# Patient Record
Sex: Male | Born: 1965 | Race: White | Hispanic: No | Marital: Married | Smoking: Never smoker
Health system: Southern US, Community
[De-identification: ages and names within clinical notes are randomized; demographics above are authoritative.]

## PROBLEM LIST (undated history)

## (undated) DIAGNOSIS — R519 Headache, unspecified: Secondary | ICD-10-CM

## (undated) DIAGNOSIS — C801 Malignant (primary) neoplasm, unspecified: Secondary | ICD-10-CM

## (undated) DIAGNOSIS — Z972 Presence of dental prosthetic device (complete) (partial): Secondary | ICD-10-CM

## (undated) NOTE — *Deleted (*Deleted)
[facility]   [phone] [phone]   Clinic Follow up Note   Patient Care Team: Deatra James, MD as PCP - General (Family Medicine) Radonna Ricker, RN as Oncology Nurse Navigator Malachy Mood, MD as Consulting Physician (Oncology) Deatra James, MD as Consulting Physician (Family Medicine) Jeani Hawking, MD as Consulting Physician (Gastroenterology)  Date of Service:  01/23/2020  CHIEF COMPLAINT: F/u of Esophageal cancer   SUMMARY OF ONCOLOGIC HISTORY: Oncology History Overview Note  Cancer Staging No matching staging information was found for the patient.    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 Biopsied. 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 - 3 cm hiatal hernia. - Normal stomach. - Normal examined duodenum    01/09/2020 Initial Biopsy   FINAL MICROSCOPIC DIAGNOSIS:   A. ESOPHAGEAL MASS, BIOPSY:  -  Adenocarcinoma arising in a background of high-grade dysplasia with  focal intestinal metaplasia  -  See comment   COMMENT:   Based on the biopsy, the adenocarcinoma appears poorly differentiated.  Tissue is available for ancillary studies upon request.  Dr. Elnoria Howard was  notified of these results on January 13, 2020.  Dr. Kenard Gower reviewed  the case and agrees with the above diagnosis.    01/09/2020 Imaging   CT chest W contrast 01/09/20  IMPRESSION: 1. 5 x 4 cm mid to lower esophageal mass with necrotic appearing paraesophageal and gastrohepatic ligament lymph nodes. 2. 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 No findings suspicious for hepatic metastatic disease. 3. Small basilar pulmonary nodules, indeterminate.  Attention on future scans is suggested. 4. Multiple bilateral rib lesions worrisome for metastatic disease. 5. Recommend PET-CT for further evaluation and staging. 6. Age advanced atherosclerotic calcifications involving the aorta and iliac arteries. 7. Aortic atherosclerosis.   Aortic Atherosclerosis (ICD10-I70.0).     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     01/22/2020 PET scan   IMPRESSION: 1. Hypermetabolic large distal esophageal mass with scattered skeletal metastatic lesions as well as hypermetabolic nodal involvement in the lower chest, and also in the upper abdomen just below the hiatus. 2. Benign liver lesions compatible with hemangiomas and cysts. 3.  Aortic Atherosclerosis (ICD10-I70.0).  Coronary atherosclerosis. 4. The very small pulmonary nodules previously shown primarily at the lung bases are not appreciably hypermetabolic but are below sensitive PET-CT size thresholds, and merit surveillance.   02/02/2020 -  Radiation Therapy   PENDING Concurrent chemo Radiation with Dr Mitzi Hansen starting 02/02/20   02/02/2020 -  Chemotherapy   PENDING Concurrent chemo Radiation with weekly CT starting 02/02/20      CURRENT THERAPY:  PENDING Concurrent chemo Radiation with weekly CT starting 02/02/20  INTERVAL HISTORY: *** Mark Buck is here for a follow up of Esophageal cancer. He presents to the clinic alone.    REVIEW OF SYSTEMS:  *** Constitutional: Denies fevers, chills or abnormal weight loss Eyes: Denies blurriness of vision Ears, nose, mouth, throat, and face: Denies mucositis or sore throat Respiratory: Denies cough, dyspnea or wheezes Cardiovascular: Denies palpitation, chest  discomfort or lower extremity swelling Gastrointestinal:  Denies nausea, heartburn or  change in bowel habits Skin: Denies abnormal skin rashes Lymphatics: Denies new lymphadenopathy or easy bruising Neurological:Denies numbness, tingling or new weaknesses Behavioral/Psych: Mood is stable, no new changes  All other systems were reviewed with the patient and are negative.  MEDICAL HISTORY:  Past Medical History:  Diagnosis Date  . Cancer (HCC)    esophageal cancer  . Headache   . Wears dentures     SURGICAL HISTORY: Past Surgical History:  Procedure Laterality Date  . BIOPSY  01/09/2020   Procedure: BIOPSY;  Surgeon: Jeani Hawking, MD;  Location: [facility];  Service: Endoscopy;;  . ESOPHAGOGASTRODUODENOSCOPY (EGD) WITH PROPOFOL N/A 01/09/2020   Procedure: ESOPHAGOGASTRODUODENOSCOPY (EGD) WITH PROPOFOL;  Surgeon: Jeani Hawking, MD;  Location: [facility];  Service: Endoscopy;  Laterality: N/A;    I have reviewed the social history and family history with the patient and they are unchanged from previous note.  ALLERGIES:  is allergic to codeine.  MEDICATIONS:  Current Outpatient Medications  Medication Sig Dispense Refill  . ondansetron (ZOFRAN) 8 MG tablet Take 1 tablet (8 mg total) by mouth 2 (two) times daily as needed for refractory nausea / vomiting. Start on day 3 after chemo. 30 tablet 1  . prochlorperazine (COMPAZINE) 10 MG tablet Take 1 tablet (10 mg total) by mouth every 6 (six) hours as needed (Nausea or vomiting). 30 tablet 1   No current facility-administered medications for this visit.    PHYSICAL EXAMINATION: ECOG PERFORMANCE STATUS: {CHL ONC ECOG PS:[id number]}  There were no vitals filed for this visit. There were no vitals filed for this visit. *** GENERAL:alert, no distress and comfortable SKIN: skin color, texture, turgor are normal, no rashes or significant lesions EYES: normal, Conjunctiva are pink and non-injected, sclera clear {OROPHARYNX:no exudate, no erythema and lips, buccal mucosa, and tongue normal}  NECK: supple, thyroid  normal size, non-tender, without nodularity LYMPH:  no palpable lymphadenopathy in the cervical, axillary {or inguinal} LUNGS: clear to auscultation and percussion with normal breathing effort HEART: regular rate & rhythm and no murmurs and no lower extremity edema ABDOMEN:abdomen soft, non-tender and normal bowel sounds Musculoskeletal:no cyanosis of digits and no clubbing  NEURO: alert & oriented x 3 with fluent speech, no focal motor/sensory deficits  LABORATORY DATA:  I have reviewed the data as listed CBC Latest Ref Rng & Units 01/15/2020  WBC 4.0 - 10.5 K/uL 7.1  Hemoglobin 13.0 - 17.0 g/dL 08.6  Hematocrit 39 - 52 % 47.2  Platelets 150 - 400 K/uL 259     CMP Latest Ref Rng & Units 01/15/2020  Glucose 70 - 99 mg/dL 96  BUN 6 - 20 mg/dL 8  Creatinine 5.78 - 4.69 mg/dL 6.29  Sodium 528 - 413 mmol/L 136  Potassium 3.5 - 5.1 mmol/L 3.5  Chloride 98 - 111 mmol/L 102  CO2 22 - 32 mmol/L 26  Calcium 8.9 - 10.3 mg/dL 9.2  Total Protein 6.5 - 8.1 g/dL 7.0  Total Bilirubin 0.3 - 1.2 mg/dL 2.4(M)  Alkaline Phos 38 - 126 U/L 202(H)  AST 15 - 41 U/L 20  ALT 0 - 44 U/L 21      RADIOGRAPHIC STUDIES: I have personally reviewed the radiological images as listed and agreed with the findings in the report. NM PET Image Initial (PI) Skull Base To Thigh  Result Date: 01/22/2020 CLINICAL DATA:  Initial treatment strategy for esophageal cancer. EXAM: NUCLEAR MEDICINE PET SKULL BASE TO THIGH TECHNIQUE:  9.9 mCi F-18 FDG was injected intravenously. Full-ring PET imaging was performed from the skull base to thigh after the radiotracer. CT data was obtained and used for attenuation correction and anatomic localization. Fasting blood glucose: 85 mg/dl COMPARISON:  96/29/5284 CT scan FINDINGS: Mediastinal blood pool activity: SUV max 2.4 Liver activity: SUV max NA NECK: No significant abnormal hypermetabolic activity in this region. Incidental CT findings: none CHEST: Distal esophageal mass maximum SUV  15.1. Posterior para-aortic lymph node 1.3 cm in short axis on image 104/3, maximum SUV 10.3, compatible with malignancy. A smaller paraesophageal lymph node just cephalad to the level of the mass measures 0.7 cm in short axis on image 98 of series 3 with maximum SUV of 3.5 probably reflecting early malignant involvement. The small pulmonary nodules shown on the prior exam are not appreciably hypermetabolic but are below sensitive PET-CT size thresholds and merit surveillance. Incidental CT findings: Coronary, aortic arch, and branch vessel atherosclerotic vascular disease. Azygos fissure noted. ABDOMEN/PELVIS: Hypermetabolic right gastric lymph nodes just below the hiatus. The more right eccentric lymph node measures 1.5 cm in short axis on image 148 of series 3 with maximum SUV of 8.3, compatible with malignant involvement. Incidental CT findings: None of the liver lesions are hypermetabolic, these are compatible with hemangiomas and cysts. Aortoiliac atherosclerotic vascular disease. SKELETON: Hypermetabolic osseous metastatic lesions involving multiple ribs, the right inferior scapula, the bony pelvis, and the right proximal femur. There is also a suspected right vertebral body lesion at the T11 vertebral level index lytic expansile lesion of the right seventh rib has a maximum SUV of 13.0. Index lytic lesion of the left anterior iliac bone has a maximum SUV of 11.0. Incidental CT findings: none IMPRESSION: 1. Hypermetabolic large distal esophageal mass with scattered skeletal metastatic lesions as well as hypermetabolic nodal involvement in the lower chest, and also in the upper abdomen just below the hiatus. 2. Benign liver lesions compatible with hemangiomas and cysts. 3.  Aortic Atherosclerosis (ICD10-I70.0).  Coronary atherosclerosis. 4. The very small pulmonary nodules previously shown primarily at the lung bases are not appreciably hypermetabolic but are below sensitive PET-CT size thresholds, and merit  surveillance. Electronically Signed   By: Gaylyn Rong M.D.   On: 01/22/2020 15:24     ASSESSMENT & PLAN:  Mark Buck is a 2 y.o. male with    1. Mid-low esophageal adenocarcinoma, poorly differentiated, TxNxMx -I reviewed his endoscopy findings, biopsy results, with patient and his wife in details. The tumor in the mid and low esophagus is causing significant obstruction, Dr. Elnoria Howard was not able to pass the scope. This is likely locally advanced disease. -We reviewed his staging CT scanning image in person, and I discussed he has had recent fall and injury to his right side, so the bone lesions in his right ribs are likely related to the trauma. Multiple liver lesions to be hemangioma, no other definitive evidence of distant metastasis on CT. -Plan to proceed with PET scan for further staging, if it is negative for distant metastasis, I do not feel he needs EUS for staging, this is likely at least T3 lesion  -due to his headaches, rad/onc has ordered brain MRI  -If PET shows possible distant metastasis, will consider further biopsy -I discussed standard treatment for locally advanced esophageal cancer, which includes neoadjuvant concurrent chemoradiation with weekly carboplatin and paclitaxel for 5 to 6 weeks, esophagectomy, and adjuvant nivolumab for 1 year with residual disease on surgical sample.  -He is young and  fit, will be a good candidate for intensive treatment. --Chemotherapy consent: Side effects including but does not not limited to, fatigue, nausea, vomiting, diarrhea, hair loss, neuropathy, fluid retention, renal and kidney dysfunction, neutropenic fever, needed for blood transfusion, bleeding, were discussed with patient in great detail.He agrees to proceed. -plan to start treatment in a few weeks   2. Dysphagia, odynophagia, significant weight loss, protein and calorie malnutrition -Secondary to his newly diagnosed esophageal cancer -We reviewed nutrition supplement,  will give him samples of nutrition supplements today -urgent dietician referral -will refer him to cardiothoracic surgeon Dr. Cliffton Asters for evaluation and possible J-tube placement    PLAN:  -lab today and IVF  -urgent dietician referral  -urgent referral to cardiothoracic surgeon Dr. Cliffton Asters -PET and brain MRI are scheduled for next week  -chemo class next week -f/u after above workup     No problem-specific Assessment & Plan notes found for this encounter.   No orders of the defined types were placed in this encounter.  All questions were answered. The patient knows to call the clinic with any problems, questions or concerns. No barriers to learning was detected. The total time spent in the appointment was {CHL ONC TIME VISIT - ZOXWR:[id number]}.     Delphina Cahill 01/23/2020   Rogelia Rohrer, am acting as scribe for Malachy Mood, MD.   {Add scribe attestation statement}

---

## 1998-02-15 ENCOUNTER — Ambulatory Visit (HOSPITAL_COMMUNITY): Admission: RE | Admit: 1998-02-15 | Discharge: 1998-02-15 | Payer: Self-pay | Admitting: Family Medicine

## 1998-02-15 ENCOUNTER — Encounter: Payer: Self-pay | Admitting: Family Medicine

## 2000-11-21 ENCOUNTER — Ambulatory Visit (HOSPITAL_COMMUNITY): Admission: RE | Admit: 2000-11-21 | Discharge: 2000-11-21 | Payer: Self-pay | Admitting: Family Medicine

## 2002-10-21 ENCOUNTER — Emergency Department (HOSPITAL_COMMUNITY): Admission: AC | Admit: 2002-10-21 | Discharge: 2002-10-21 | Payer: Self-pay

## 2002-10-21 ENCOUNTER — Encounter: Payer: Self-pay | Admitting: Surgery

## 2015-12-21 ENCOUNTER — Ambulatory Visit (INDEPENDENT_AMBULATORY_CARE_PROVIDER_SITE_OTHER): Payer: Self-pay

## 2015-12-21 ENCOUNTER — Encounter (HOSPITAL_COMMUNITY): Payer: Self-pay | Admitting: Family Medicine

## 2015-12-21 ENCOUNTER — Ambulatory Visit (HOSPITAL_COMMUNITY)
Admission: EM | Admit: 2015-12-21 | Discharge: 2015-12-21 | Disposition: A | Discharge status: Home / Self Care | Payer: Self-pay | Attending: Family Medicine | Admitting: Family Medicine

## 2015-12-21 DIAGNOSIS — M25511 Pain in right shoulder: Secondary | ICD-10-CM

## 2015-12-21 DIAGNOSIS — R0789 Other chest pain: Secondary | ICD-10-CM

## 2015-12-21 DIAGNOSIS — W19XXXA Unspecified fall, initial encounter: Secondary | ICD-10-CM

## 2015-12-21 NOTE — ED Triage Notes (Signed)
Pt here for fall off the back of his truck that happened Saturday. sts about 4 feet. sts that he fell on right side hurting right shoulder and ribs. sts hard to lay flat due to pain. Denies any trouble breathing.

## 2015-12-21 NOTE — Discharge Instructions (Signed)
You appear to have 2 cracked ribs. These usually takes several weeks to heal. They're in the right place and they're not interfering with your lung function.

## 2015-12-21 NOTE — ED Provider Notes (Signed)
[place]    CSN: [id number] Arrival date & time: 12/21/15  1924  First Provider Contact:  First MD Initiated Contact with Patient 12/21/15 2006        History   Chief Complaint Chief Complaint  Patient presents with  . Fall  . Rib Injury  . Shoulder Pain    HPI Mark Buck is a 50 y.o. male.   This is a 50 year old gentleman brought in by his wife after having fallen 6 days ago. He complains about right shoulder and right rib pain after having fallen off of a truck and landing with his outstretched arm. He first he had severe pain and difficulty moving his arm, but subsequently, he is been able to use his arm.  He's been going to work since the injury.  Patient's had no shortness of breath. He does have extreme pain in his right lateral ribs when he coughs or sneezes. He also says that it is difficult lying flat on his back because of pain.      History reviewed. No pertinent past medical history.  There are no active problems to display for this patient.   History reviewed. No pertinent surgical history.     Home Medications    Prior to Admission medications   Not on File    Family History History reviewed. No pertinent family history.  Social History Social History  Substance Use Topics  . Smoking status: Never Smoker  . Smokeless tobacco: Never Used  . Alcohol use Not on file     Allergies   Review of patient's allergies indicates no known allergies.   Review of Systems Review of Systems  Constitutional: Negative.   HENT: Negative.   Respiratory: Positive for chest tightness. Negative for shortness of breath, wheezing and stridor.   Cardiovascular: Positive for chest pain. Negative for palpitations and leg swelling.  Gastrointestinal: Negative.   Genitourinary: Negative.   Neurological: Negative.      Physical Exam Triage Vital Signs ED Triage Vitals  Enc Vitals Group     BP 12/21/15 2001 137/96     Pulse Rate  12/21/15 2001 80     Resp 12/21/15 2001 12     Temp 12/21/15 2001 98.7 F (37.1 C)     Temp Source 12/21/15 2001 Oral     SpO2 12/21/15 2001 99 %     Weight --      Height --      Head Circumference --      Peak Flow --      Pain Score 12/21/15 2009 4     Pain Loc --      Pain Edu? --      Excl. in [place]? --    No data found.   Updated Vital Signs BP 137/96 (BP Location: Left Arm)   Pulse 80   Temp 98.7 F (37.1 C) (Oral)   Resp 12   SpO2 99%   Visual Acuity     Physical Exam  Constitutional: He is oriented to person, place, and time. He appears well-developed and well-nourished.  HENT:  Head: Normocephalic and atraumatic.  Nose: Nose normal.  Eyes: Conjunctivae and EOM are normal. Pupils are equal, round, and reactive to light.  Neck: Normal range of motion. Neck supple.  Cardiovascular: Normal rate, regular rhythm and normal heart sounds.   Pulmonary/Chest: Effort normal and breath sounds normal.  Abdominal: Soft.  Musculoskeletal: Normal range of motion.  Neurological: He is alert and oriented to  person, place, and time. No cranial nerve deficit. Coordination normal.  Patient is able to move his right upper extremity all directions without problem  Skin: Skin is warm and dry.  Nursing note and vitals reviewed.    UC Treatments / Results  Labs (all labs ordered are listed, but only abnormal results are displayed) Labs Reviewed - No data to display  EKG  EKG Interpretation None       Radiology Dg Ribs Unilateral W/chest Right  Result Date: 12/21/2015 CLINICAL DATA:  Patient fell off the back of a truck and injured his rt side including his rt shoulder and ribs, injury happened 1 week ago. EXAM: RIGHT RIBS AND CHEST - 3+ VIEW COMPARISON:  None. FINDINGS: No pneumothorax. Scarring or subsegmental atelectasis at the left lung base, contralateral to the side of injury. Azygos fissure noted. Cardiac and mediastinal margins appear normal. Right anterior sixth and  seventh rib irregularities on the final image, potentially from nondisplaced fractures. IMPRESSION: 1. Possible nondisplaced right anterior fifth and sixth rib fractures. 2. No right pleural effusion or pneumothorax. 3. Blunting of the left lateral costophrenic angle, probably from scarring. Electronically Signed   By: Van Clines M.D.   On: 12/21/2015 21:24   Dg Shoulder Right  Result Date: 12/21/2015 CLINICAL DATA:  Fall from a truck, injuring the right shoulder. EXAM: RIGHT SHOULDER - 2+ VIEW FINDINGS: No glenohumeral or AC joint malalignment. I do not see a clavicular fracture or other fracture involving the shoulder. IMPRESSION: Negative. Electronically Signed   By: Van Clines M.D.   On: 12/21/2015 21:25    Procedures Procedures (including critical care time)  Medications Ordered in UC Medications - No data to display   Initial Impression / Assessment and Plan / UC Course  I have reviewed the triage vital signs and the nursing notes.  Pertinent labs & imaging results that were available during my care of the patient were reviewed by me and considered in my medical decision making (see chart for details).  Clinical Course      Final Clinical Impressions(s) / UC Diagnoses   Final diagnoses:  Shoulder pain, acute, right  Fall, initial encounter  Chest wall pain    New Prescriptions New Prescriptions   No medications on file  Patient refuses medication. He just wanted to make sure he did not have internal damage.   Robyn Haber, MD 12/21/15 2130

## 2016-08-08 ENCOUNTER — Telehealth: Payer: Self-pay | Admitting: Physician Assistant

## 2016-08-08 ENCOUNTER — Ambulatory Visit (INDEPENDENT_AMBULATORY_CARE_PROVIDER_SITE_OTHER): Payer: Self-pay | Admitting: Physician Assistant

## 2016-08-08 VITALS — BP 134/88 | HR 72 | Temp 98.1°F | Resp 17 | Ht 70.0 in | Wt 214.0 lb

## 2016-08-08 DIAGNOSIS — Z0289 Encounter for other administrative examinations: Secondary | ICD-10-CM

## 2016-08-08 NOTE — Patient Instructions (Signed)
     IF you received an x-ray today, you will receive an invoice from [facility]. Please contact [facility] at [phone] with questions or concerns regarding your invoice.   IF you received labwork today, you will receive an invoice from [facility]. Please contact [facility] at [phone] with questions or concerns regarding your invoice.   Our billing staff will not be able to assist you with questions regarding bills from these companies.  You will be contacted with the lab results as soon as they are available. The fastest way to get your results is to activate your My Chart account. Instructions are located on the last page of this paperwork. If you have not heard from us regarding the results in 2 weeks, please contact this office.     

## 2016-08-08 NOTE — Telephone Encounter (Signed)
Pt was returning Stephanie's call regarding some paperwork.   Please Advise [phone]

## 2016-08-09 NOTE — Telephone Encounter (Signed)
PLEASE obtain more information.  I did not contact him.  DOT was performed.  Given to staff upfront for check out.

## 2016-08-10 NOTE — Progress Notes (Signed)
PRIMARY CARE AT [facility] [address] [place] [address] [phone]  Date:  08/08/2016   Name:  Mark Buck   DOB:  07/04/1965   MRN:  [id number]  PCP:  No PCP Per Patient    History of Present Illness:  Mark Buck is a 51 y.o. male patient who presents to PCP with  Chief Complaint  Patient presents with  . Employment Physical    DOT     No concerns at this time.   There are no active problems to display for this patient.   No past medical history on file.  No past surgical history on file.  Social History  Substance Use Topics  . Smoking status: Never Smoker  . Smokeless tobacco: Never Used  . Alcohol use No    No family history on file.  Allergies  Allergen Reactions  . Codeine Nausea And Vomiting    Medication list has been reviewed and updated.  No current outpatient prescriptions on file prior to visit.   No current facility-administered medications on file prior to visit.     ROS ROS otherwise unremarkable unless listed above.  Physical Examination: BP 134/88   Pulse 72   Temp 98.1 F (36.7 C) (Oral)   Resp 17   Ht 5\' 10"  (1.778 m)   Wt 214 lb (97.1 kg)   SpO2 97%   BMI 30.71 kg/m  Ideal Body Weight: Weight in (lb) to have BMI = 25: 173.9  Physical Exam  Constitutional: He is oriented to person, place, and time. He appears well-developed and well-nourished. No distress.  HENT:  Head: Normocephalic and atraumatic.  Right Ear: Tympanic membrane, external ear and ear canal normal.  Left Ear: Tympanic membrane, external ear and ear canal normal.  Eyes: Conjunctivae and EOM are normal. Pupils are equal, round, and reactive to light.  Cardiovascular: Normal rate and regular rhythm.  Exam reveals no friction rub.   No murmur heard. Pulmonary/Chest: Effort normal. No respiratory distress. He has no wheezes.  Abdominal: Soft. Bowel sounds are normal. He exhibits no distension and no mass. There is no tenderness. Hernia confirmed  negative in the right inguinal area and confirmed negative in the left inguinal area.  Musculoskeletal: Normal range of motion. He exhibits no edema or tenderness.  Neurological: He is alert and oriented to person, place, and time. He displays normal reflexes.  Skin: Skin is warm and dry. He is not diaphoretic.  Psychiatric: He has a normal mood and affect. His behavior is normal.     Assessment and Plan: Mark Buck is a 51 y.o. male who is here today for cc of dot.   Encounter for examination required by Department of Transportation (DOT)  Ivar Drape, PA-C [facility] 4/26/20186:23 AM

## 2017-07-16 ENCOUNTER — Encounter: Payer: Self-pay | Admitting: Physician Assistant

## 2017-12-12 ENCOUNTER — Emergency Department (HOSPITAL_COMMUNITY)
Admission: EM | Admit: 2017-12-12 | Discharge: 2017-12-12 | Disposition: A | Discharge status: Home / Self Care | Payer: 59 | Attending: Emergency Medicine | Admitting: Emergency Medicine

## 2017-12-12 ENCOUNTER — Ambulatory Visit (HOSPITAL_COMMUNITY)
Admission: EM | Admit: 2017-12-12 | Discharge: 2017-12-12 | Disposition: A | Discharge status: Other Acute Inpatient Hospital | Payer: Self-pay

## 2017-12-12 ENCOUNTER — Encounter (HOSPITAL_COMMUNITY): Payer: Self-pay | Admitting: *Deleted

## 2017-12-12 ENCOUNTER — Emergency Department (HOSPITAL_COMMUNITY): Payer: 59

## 2017-12-12 DIAGNOSIS — S0990XA Unspecified injury of head, initial encounter: Secondary | ICD-10-CM | POA: Diagnosis present

## 2017-12-12 DIAGNOSIS — S01512A Laceration without foreign body of oral cavity, initial encounter: Secondary | ICD-10-CM | POA: Diagnosis not present

## 2017-12-12 DIAGNOSIS — Y929 Unspecified place or not applicable: Secondary | ICD-10-CM | POA: Insufficient documentation

## 2017-12-12 DIAGNOSIS — S0181XA Laceration without foreign body of other part of head, initial encounter: Secondary | ICD-10-CM | POA: Diagnosis not present

## 2017-12-12 DIAGNOSIS — S0083XA Contusion of other part of head, initial encounter: Secondary | ICD-10-CM

## 2017-12-12 DIAGNOSIS — Z23 Encounter for immunization: Secondary | ICD-10-CM | POA: Insufficient documentation

## 2017-12-12 DIAGNOSIS — Y939 Activity, unspecified: Secondary | ICD-10-CM | POA: Diagnosis not present

## 2017-12-12 MED ORDER — TETANUS-DIPHTH-ACELL PERTUSSIS 5-2.5-18.5 LF-MCG/0.5 IM SUSP
0.5000 mL | Freq: Once | INTRAMUSCULAR | Status: AC
  Administered 2017-12-12: 0.5 mL via INTRAMUSCULAR
  Filled 2017-12-12: qty 0.5

## 2017-12-12 MED ORDER — ONDANSETRON 4 MG PO TBDP
4.0000 mg | ORAL_TABLET | Freq: Once | ORAL | Status: AC
  Administered 2017-12-12: 4 mg via ORAL
  Filled 2017-12-12: qty 1

## 2017-12-12 MED ORDER — CEPHALEXIN 250 MG PO CAPS
500.0000 mg | ORAL_CAPSULE | Freq: Once | ORAL | Status: AC
  Administered 2017-12-12: 500 mg via ORAL
  Filled 2017-12-12: qty 2

## 2017-12-12 MED ORDER — HYDROMORPHONE HCL 1 MG/ML IJ SOLN
1.0000 mg | Freq: Once | INTRAMUSCULAR | Status: AC
  Administered 2017-12-12: 1 mg via INTRAVENOUS
  Filled 2017-12-12: qty 1

## 2017-12-12 MED ORDER — CEPHALEXIN 500 MG PO CAPS: 500.0000 mg | ORAL_CAPSULE | Freq: Three times a day (TID) | ORAL | 0 refills | Status: DC

## 2017-12-12 MED ORDER — CHLORHEXIDINE GLUCONATE 0.12% ORAL RINSE (MEDLINE KIT): 15.0000 mL | Freq: Two times a day (BID) | OROMUCOSAL | 0 refills | Status: DC

## 2017-12-12 MED ORDER — OXYCODONE-ACETAMINOPHEN 5-325 MG PO TABS: 1.0000 | ORAL_TABLET | Freq: Four times a day (QID) | ORAL | 0 refills | Status: DC | PRN

## 2017-12-12 MED ORDER — HYDROMORPHONE HCL 1 MG/ML IJ SOLN
1.0000 mg | Freq: Once | INTRAMUSCULAR | Status: AC
  Administered 2017-12-12: 1 mg via INTRAMUSCULAR
  Filled 2017-12-12: qty 1

## 2017-12-12 MED ORDER — LIDOCAINE HCL 2 % IJ SOLN
20.0000 mL | Freq: Once | INTRAMUSCULAR | Status: AC
  Administered 2017-12-12: 400 mg
  Filled 2017-12-12: qty 20

## 2017-12-12 MED ORDER — ONDANSETRON HCL 4 MG PO TABS: 4.0000 mg | ORAL_TABLET | Freq: Four times a day (QID) | ORAL | 0 refills | Status: DC

## 2017-12-12 NOTE — Discharge Instructions (Addendum)
Please read attached information. If you experience any new or worsening signs or symptoms please return to the emergency room for evaluation. Please follow-up with your primary care provider or specialist as discussed. Please use medication prescribed only as directed and discontinue taking if you have any concerning signs or symptoms.   °

## 2017-12-12 NOTE — ED Notes (Signed)
Pt to CT

## 2017-12-12 NOTE — ED Notes (Signed)
ED Provider at bedside. 

## 2017-12-12 NOTE — ED Triage Notes (Signed)
Pt in after a fall at work, was on a scooter and fell and landed on his face, laceration noted to his forehead, laceration to his tongue and inside of his lower lip, denies LOC, fall was witnessed, abrasions noted to bilateral arms as well

## 2017-12-12 NOTE — ED Notes (Signed)
PA-C at bedside for procedure.

## 2017-12-12 NOTE — ED Provider Notes (Signed)
[facility]ER[facility] DEPARTMENT Provider Note   CSN: [id number] Arrival date & time: 12/12/17  1304   History   Chief Complaint Chief Complaint  Patient presents with  . Fall    HPI Mark Buck is a 52 y.o. male.  HPI   52 year old male presents status post fall. He was riding an [employer] when he fell off treating his face on the ground. He notes superficial abrasions laceration to the forehead, laceration to his tongue and lower lip. Patient denies any loss of consciousness, no confusion, no neurological deficits neck pain back pain.patient is uncertain of his last tetanus vaccination.   History reviewed. No pertinent past medical history.  There are no active problems to display for this patient.   History reviewed. No pertinent surgical history.      Home Medications    Prior to Admission medications   Medication Sig Start Date End Date Taking? Authorizing Provider  cephALEXin (KEFLEX) 500 MG capsule Take 1 capsule (500 mg total) by mouth 3 (three) times daily. 12/12/17   Virlan Kempker, Dellis Filbert, PA-C  chlorhexidine gluconate, MEDLINE KIT, (PERIDEX) 0.12 % solution Use as directed 15 mLs in the mouth or throat 2 (two) times daily. 12/12/17   Tashonda Pinkus, Dellis Filbert, PA-C  oxyCODONE-acetaminophen (PERCOCET/ROXICET) 5-325 MG tablet Take 1 tablet by mouth every 6 (six) hours as needed for severe pain. 12/12/17   Okey Regal, PA-C    Family History History reviewed. No pertinent family history.  Social History Social History   Tobacco Use  . Smoking status: Never Smoker  . Smokeless tobacco: Never Used  Substance Use Topics  . Alcohol use: No  . Drug use: No     Allergies   Codeine   Review of Systems Review of Systems  All other systems reviewed and are negative.    Physical Exam Updated Vital Signs BP (!) 159/102 (BP Location: Right Arm)   Pulse 71   Temp 98.7 F (37.1 C) (Oral)   Resp 18   SpO2 96%   Physical Exam    Constitutional: He is oriented to person, place, and time. He appears well-developed and well-nourished.  HENT:  Head: Normocephalic and atraumatic.  Superficial abrasion noted to the forehead nose and chin, 2 cm laceration to the forehead- lower lip with abrasions, gumline mucosa with tear away from the gumline approximately 1.5 cm- tong with a 3 cm laceration linear with star formation at the distal aspect, bottom the tongue intact no full thickness laceration  1 cm laceration to left inner lip  Eyes: Pupils are equal, round, and reactive to light. Conjunctivae are normal. Right eye exhibits no discharge. Left eye exhibits no discharge. No scleral icterus.  Neck: Normal range of motion. No JVD present. No tracheal deviation present.  Pulmonary/Chest: Effort normal. No stridor.  Musculoskeletal:  No C or T-spine tenderness to palpation neck supple. Full range of motion  Neurological: He is alert and oriented to person, place, and time. Coordination normal.  Psychiatric: He has a normal mood and affect. His behavior is normal. Judgment and thought content normal.  Nursing note and vitals reviewed.   ED Treatments / Results  Labs (all labs ordered are listed, but only abnormal results are displayed) Labs Reviewed - No data to display  EKG None  Radiology Ct Head Wo Contrast  Result Date: 12/12/2017 CLINICAL DATA:  Facial laceration after fall off scooter at work. EXAM: CT HEAD WITHOUT CONTRAST CT MAXILLOFACIAL WITHOUT CONTRAST CT CERVICAL SPINE WITHOUT CONTRAST TECHNIQUE:  Multidetector CT imaging of the head, cervical spine, and maxillofacial structures were performed using the standard protocol without intravenous contrast. Multiplanar CT image reconstructions of the cervical spine and maxillofacial structures were also generated. COMPARISON:  None. FINDINGS: CT HEAD FINDINGS Brain: No evidence of acute infarction, hemorrhage, hydrocephalus, extra-axial collection or mass lesion/mass  effect. Vascular: No hyperdense vessel or unexpected calcification. Skull: Bilateral parotid craniotomies are noted. No acute fracture is noted. Other: Mild left frontal scalp laceration and hematoma is noted. CT MAXILLOFACIAL FINDINGS Osseous: No fracture or mandibular dislocation. No destructive process. Orbits: Negative. No traumatic or inflammatory finding. Sinuses: Clear. Soft tissues: Negative. CT CERVICAL SPINE FINDINGS Alignment: Normal. Skull base and vertebrae: No acute fracture. No primary bone lesion or focal pathologic process. Soft tissues and spinal canal: No prevertebral fluid or swelling. No visible canal hematoma. Disc levels:  Normal. Upper chest: Negative. Other: None. IMPRESSION: Mild left frontal scalp laceration and hematoma. No acute intracranial abnormality seen. No abnormality seen in the maxillofacial region. Normal cervical spine. Electronically Signed   By: Marijo Conception, M.D.   On: 12/12/2017 14:50   Ct Cervical Spine Wo Contrast  Result Date: 12/12/2017 CLINICAL DATA:  Facial laceration after fall off scooter at work. EXAM: CT HEAD WITHOUT CONTRAST CT MAXILLOFACIAL WITHOUT CONTRAST CT CERVICAL SPINE WITHOUT CONTRAST TECHNIQUE: Multidetector CT imaging of the head, cervical spine, and maxillofacial structures were performed using the standard protocol without intravenous contrast. Multiplanar CT image reconstructions of the cervical spine and maxillofacial structures were also generated. COMPARISON:  None. FINDINGS: CT HEAD FINDINGS Brain: No evidence of acute infarction, hemorrhage, hydrocephalus, extra-axial collection or mass lesion/mass effect. Vascular: No hyperdense vessel or unexpected calcification. Skull: Bilateral parotid craniotomies are noted. No acute fracture is noted. Other: Mild left frontal scalp laceration and hematoma is noted. CT MAXILLOFACIAL FINDINGS Osseous: No fracture or mandibular dislocation. No destructive process. Orbits: Negative. No traumatic or  inflammatory finding. Sinuses: Clear. Soft tissues: Negative. CT CERVICAL SPINE FINDINGS Alignment: Normal. Skull base and vertebrae: No acute fracture. No primary bone lesion or focal pathologic process. Soft tissues and spinal canal: No prevertebral fluid or swelling. No visible canal hematoma. Disc levels:  Normal. Upper chest: Negative. Other: None. IMPRESSION: Mild left frontal scalp laceration and hematoma. No acute intracranial abnormality seen. No abnormality seen in the maxillofacial region. Normal cervical spine. Electronically Signed   By: Marijo Conception, M.D.   On: 12/12/2017 14:50   Ct Maxillofacial Wo Contrast  Result Date: 12/12/2017 CLINICAL DATA:  Facial laceration after fall off scooter at work. EXAM: CT HEAD WITHOUT CONTRAST CT MAXILLOFACIAL WITHOUT CONTRAST CT CERVICAL SPINE WITHOUT CONTRAST TECHNIQUE: Multidetector CT imaging of the head, cervical spine, and maxillofacial structures were performed using the standard protocol without intravenous contrast. Multiplanar CT image reconstructions of the cervical spine and maxillofacial structures were also generated. COMPARISON:  None. FINDINGS: CT HEAD FINDINGS Brain: No evidence of acute infarction, hemorrhage, hydrocephalus, extra-axial collection or mass lesion/mass effect. Vascular: No hyperdense vessel or unexpected calcification. Skull: Bilateral parotid craniotomies are noted. No acute fracture is noted. Other: Mild left frontal scalp laceration and hematoma is noted. CT MAXILLOFACIAL FINDINGS Osseous: No fracture or mandibular dislocation. No destructive process. Orbits: Negative. No traumatic or inflammatory finding. Sinuses: Clear. Soft tissues: Negative. CT CERVICAL SPINE FINDINGS Alignment: Normal. Skull base and vertebrae: No acute fracture. No primary bone lesion or focal pathologic process. Soft tissues and spinal canal: No prevertebral fluid or swelling. No visible canal hematoma. Disc levels:  Normal. Upper  chest: Negative.  Other: None. IMPRESSION: Mild left frontal scalp laceration and hematoma. No acute intracranial abnormality seen. No abnormality seen in the maxillofacial region. Normal cervical spine. Electronically Signed   By: Marijo Conception, M.D.   On: 12/12/2017 14:50    Procedures .Marland KitchenLaceration Repair Date/Time: 12/12/2017 4:25 PM Performed by: Okey Regal, PA-C Authorized by: Okey Regal, PA-C   Consent:    Consent obtained:  Verbal   Consent given by:  Patient   Risks discussed:  Infection, need for additional repair, pain, retained foreign body, poor cosmetic result, nerve damage and poor wound healing   Alternatives discussed:  Delayed treatment and no treatment Anesthesia (see MAR for exact dosages):    Anesthesia method:  Local infiltration   Local anesthetic:  Lidocaine 2% w/o epi Laceration details:    Location: tongue.   Length (cm):  3 Repair type:    Repair type:  Simple Exploration:    Hemostasis achieved with:  Direct pressure   Wound exploration: wound explored through full range of motion and entire depth of wound probed and visualized     Wound extent: no fascia violation noted, no foreign bodies/material noted, no muscle damage noted, no nerve damage noted and no vascular damage noted     Contaminated: yes   Skin repair:    Repair method:  Sutures   Suture size:  3-0   Suture material:  Plain gut   Suture technique:  Simple interrupted   Number of sutures:  4 Approximation:    Approximation:  Loose Post-procedure details:    Dressing:  Open (no dressing)   Patient tolerance of procedure:  Tolerated well, no immediate complications .Marland KitchenLaceration Repair Date/Time: 12/12/2017 4:26 PM Performed by: Okey Regal, PA-C Authorized by: Okey Regal, PA-C   Consent:    Consent obtained:  Verbal   Consent given by:  Patient   Risks discussed:  Infection, need for additional repair, nerve damage, pain, poor wound healing, poor cosmetic result, vascular damage and  retained foreign body   Alternatives discussed:  Delayed treatment, observation and no treatment Anesthesia (see MAR for exact dosages):    Anesthesia method:  Local infiltration   Local anesthetic:  Lidocaine 2% w/o epi Laceration details:    Location: forehead    Length (cm):  2 Repair type:    Repair type:  Simple Pre-procedure details:    Preparation:  Patient was prepped and draped in usual sterile fashion and imaging obtained to evaluate for foreign bodies Exploration:    Wound exploration: wound explored through full range of motion and entire depth of wound probed and visualized     Wound extent: foreign bodies/material and nerve damage     Wound extent: no fascia violation noted, no muscle damage noted, no underlying fracture noted and no vascular damage noted     Foreign bodies/material:  Gravel    Contaminated: yes   Treatment:    Area cleansed with:  Betadine and saline   Amount of cleaning:  Extensive   Irrigation solution:  Sterile saline   Irrigation volume:  1 liter    Irrigation method:  Syringe   Visualized foreign bodies/material removed: yes   Skin repair:    Repair method:  Sutures   Suture size:  4-0   Suture material:  Fast-absorbing gut   Suture technique:  Simple interrupted   Number of sutures:  3 Approximation:    Approximation:  Close Post-procedure details:    Dressing:  Antibiotic ointment   (including  critical care time)  Medications Ordered in ED Medications  cephALEXin (KEFLEX) capsule 500 mg (has no administration in time range)  HYDROmorphone (DILAUDID) injection 1 mg (1 mg Intramuscular Given 12/12/17 1354)  ondansetron (ZOFRAN-ODT) disintegrating tablet 4 mg (4 mg Oral Given 12/12/17 1353)  lidocaine (XYLOCAINE) 2 % (with pres) injection 400 mg (400 mg Infiltration Given by Other 12/12/17 1458)  Tdap (BOOSTRIX) injection 0.5 mL (0.5 mLs Intramuscular Given 12/12/17 1518)  HYDROmorphone (DILAUDID) injection 1 mg (1 mg Intravenous Given  12/12/17 1518)     Initial Impression / Assessment and Plan / ED Course  I have reviewed the triage vital signs and the nursing notes.  Pertinent labs & imaging results that were available during my care of the patient were reviewed by me and considered in my medical decision making (see chart for details).     Labs:   Imaging:CT head face cervical  Consults:  Therapeutics:Dilaudid, lidocaine, TDAP  Discharge Meds: Percocet, Keflex, chlorhexidine  Assessment/Plan: 52 year old male presents today status post fall. Patient has soft tissue injuries, no acute abnormalities noted on CT scan. No acute neurological deficits. Vision has a significant laceration to his tongue was repaired here with approximation with loose stitches. Patient also has a significant laceration along the lower gumline, I do not feel that closing this would be indicated at this time, given his risk of infection. Patient also with a small laceration on his left lower lip that was closed with one stitch, laceration to forehead that was contaminated extensive cleansing was performed no residual foreign bodies, repaired without complication. Patient will be placed on prophylactic antibiotics, encouraged to have a soft diet with frequent rinses with water, chlorhexidine also prescribed. I have encouraged patient to follow up closely with the next 48 hours with ENT specialist, if he has any complicating features he will return for repeat evaluation here in the emergency room, if symptoms continue to improve and no complication to follow up as an outpatient with ENT. Patient and friend verbalized understanding and agreement to today's plan had no further questions concerns at time of discharge.    Final Clinical Impressions(s) / ED Diagnoses   Final diagnoses:  Contusion of face, initial encounter  Laceration of tongue, initial encounter  Facial laceration, initial encounter    ED Discharge Orders         Ordered     chlorhexidine gluconate, MEDLINE KIT, (PERIDEX) 0.12 % solution  2 times daily     12/12/17 1450    cephALEXin (KEFLEX) 500 MG capsule  3 times daily     12/12/17 1631    oxyCODONE-acetaminophen (PERCOCET/ROXICET) 5-325 MG tablet  Every 6 hours PRN     12/12/17 1631           Francee Gentile 12/12/17 [place], Kevin, MD 12/12/17 2129

## 2017-12-12 NOTE — ED Notes (Signed)
CT will bring Pt to room when scans complete.

## 2017-12-12 NOTE — ED Notes (Signed)
Patient verbalized understanding of discharge instructions and prescription medications and denies any further needs or questions at this time. VS stable. Patient ambulatory with steady gait.  

## 2017-12-12 NOTE — ED Provider Notes (Signed)
Patient placed in Quick Look pathway, seen and evaluated   Chief Complaint: facial trauma  HPI:   Mark Buck is a 52 y.o. male who presents to the ED s/p facial injury. Patient was at work and got on his scooter and started off a concrete ramp onto gravel and lost control. Patients was holding on to the handle bars and went over them landing on his face. Coworker reports possible brief LOC. Patient with multiple facial lacerations and contusions and mouth and tongue lacerations.  ROS: HEENT: facial wounds, mouth and toung lacerations  Physical Exam:  BP (!) 161/104 (BP Location: Right Arm)   Pulse 65   Temp 98.7 F (37.1 C) (Oral)   Resp 16   SpO2 99%    Gen: No distress  Neuro: Awake and Alert  Skin: multiple facial lacerations  HEENT: TM's Normal, bleeding bilateral nostrils, facial lacerations, contusions, laceration of tongue and inside lower lip, dentures broken.      Initiation of care has begun. The patient has been counseled on the process, plan, and necessity for staying for the completion/evaluation, and the remainder of the medical screening examination    Ashley Murrain, NP 12/12/17 1332    Carmin Muskrat, MD 12/13/17 1515

## 2019-06-26 ENCOUNTER — Ambulatory Visit: Payer: Self-pay | Attending: Internal Medicine

## 2019-12-31 ENCOUNTER — Other Ambulatory Visit: Payer: Self-pay | Admitting: Family Medicine

## 2019-12-31 DIAGNOSIS — R1310 Dysphagia, unspecified: Secondary | ICD-10-CM

## 2020-01-05 ENCOUNTER — Other Ambulatory Visit: Payer: Self-pay

## 2020-01-06 ENCOUNTER — Other Ambulatory Visit: Payer: Self-pay

## 2020-01-07 ENCOUNTER — Other Ambulatory Visit: Payer: Self-pay | Admitting: Gastroenterology

## 2020-01-07 ENCOUNTER — Encounter (HOSPITAL_COMMUNITY): Payer: Self-pay | Admitting: Gastroenterology

## 2020-01-07 ENCOUNTER — Other Ambulatory Visit: Payer: Self-pay

## 2020-01-07 ENCOUNTER — Other Ambulatory Visit (HOSPITAL_COMMUNITY)
Admission: RE | Admit: 2020-01-07 | Discharge: 2020-01-07 | Disposition: A | Discharge status: Home / Self Care | Payer: No Typology Code available for payment source | Source: Ambulatory Visit | Attending: Gastroenterology | Admitting: Gastroenterology

## 2020-01-07 DIAGNOSIS — Z20822 Contact with and (suspected) exposure to covid-19: Secondary | ICD-10-CM | POA: Insufficient documentation

## 2020-01-07 DIAGNOSIS — Z01812 Encounter for preprocedural laboratory examination: Secondary | ICD-10-CM | POA: Insufficient documentation

## 2020-01-07 LAB — SARS CORONAVIRUS 2 (TAT 6-24 HRS): SARS Coronavirus 2: NEGATIVE

## 2020-01-08 NOTE — Anesthesia Preprocedure Evaluation (Addendum)
Anesthesia Evaluation  Patient identified by MRN, date of birth, ID band Patient awake    Reviewed: Allergy & Precautions, NPO status , Patient's Chart, lab work & pertinent test results  Airway Mallampati: II  TM Distance: >3 FB Neck ROM: Full    Dental no notable dental hx. (+) Teeth Intact, Dental Advisory Given   Pulmonary    Pulmonary exam normal breath sounds clear to auscultation       Cardiovascular negative cardio ROS Normal cardiovascular exam Rhythm:Regular Rate:Normal     Neuro/Psych negative neurological ROS  negative psych ROS   GI/Hepatic negative GI ROS, Neg liver ROS,   Endo/Other  negative endocrine ROS  Renal/GU negative Renal ROS     Musculoskeletal negative musculoskeletal ROS (+)   Abdominal   Peds  Hematology   Anesthesia Other Findings   Reproductive/Obstetrics                            Anesthesia Physical Anesthesia Plan  ASA: II  Anesthesia Plan: MAC   Post-op Pain Management:    Induction: Intravenous  PONV Risk Score and Plan: Treatment may vary due to age or medical condition  Airway Management Planned: Natural Airway and Nasal Cannula  Additional Equipment: None  Intra-op Plan:   Post-operative Plan:   Informed Consent: I have reviewed the patients History and Physical, chart, labs and discussed the procedure including the risks, benefits and alternatives for the proposed anesthesia with the patient or authorized representative who has indicated his/her understanding and acceptance.     Dental advisory given  Plan Discussed with: CRNA and Anesthesiologist  Anesthesia Plan Comments: (EGD for dysphagia)       Anesthesia Quick Evaluation

## 2020-01-09 ENCOUNTER — Other Ambulatory Visit: Payer: Self-pay

## 2020-01-09 ENCOUNTER — Encounter (HOSPITAL_COMMUNITY)
Admission: RE | Disposition: A | Discharge status: Other Acute Inpatient Hospital | Payer: Self-pay | Source: Home / Self Care | Attending: Gastroenterology

## 2020-01-09 ENCOUNTER — Ambulatory Visit (HOSPITAL_COMMUNITY)
Admission: RE | Admit: 2020-01-09 | Discharge: 2020-01-09 | Disposition: A | Discharge status: Home / Self Care | Payer: No Typology Code available for payment source | Attending: Gastroenterology | Admitting: Gastroenterology

## 2020-01-09 ENCOUNTER — Ambulatory Visit (HOSPITAL_COMMUNITY)
Admission: RE | Admit: 2020-01-09 | Discharge: 2020-01-09 | Disposition: A | Discharge status: Home / Self Care | Payer: No Typology Code available for payment source | Source: Ambulatory Visit | Attending: Gastroenterology | Admitting: Gastroenterology

## 2020-01-09 ENCOUNTER — Ambulatory Visit (HOSPITAL_COMMUNITY): Payer: No Typology Code available for payment source | Admitting: Anesthesiology

## 2020-01-09 ENCOUNTER — Encounter (HOSPITAL_COMMUNITY): Payer: Self-pay | Admitting: Gastroenterology

## 2020-01-09 DIAGNOSIS — R1310 Dysphagia, unspecified: Secondary | ICD-10-CM | POA: Insufficient documentation

## 2020-01-09 DIAGNOSIS — K449 Diaphragmatic hernia without obstruction or gangrene: Secondary | ICD-10-CM | POA: Insufficient documentation

## 2020-01-09 DIAGNOSIS — Z6827 Body mass index (BMI) 27.0-27.9, adult: Secondary | ICD-10-CM | POA: Diagnosis not present

## 2020-01-09 DIAGNOSIS — C158 Malignant neoplasm of overlapping sites of esophagus: Secondary | ICD-10-CM | POA: Insufficient documentation

## 2020-01-09 DIAGNOSIS — R634 Abnormal weight loss: Secondary | ICD-10-CM | POA: Diagnosis not present

## 2020-01-09 HISTORY — PX: ESOPHAGOGASTRODUODENOSCOPY (EGD) WITH PROPOFOL: SHX5813

## 2020-01-09 HISTORY — PX: BIOPSY: SHX5522

## 2020-01-09 HISTORY — DX: Presence of dental prosthetic device (complete) (partial): Z97.2

## 2020-01-09 SURGERY — ESOPHAGOGASTRODUODENOSCOPY (EGD) WITH PROPOFOL
Anesthesia: Monitor Anesthesia Care

## 2020-01-09 MED ORDER — LACTATED RINGERS IV SOLN: INTRAVENOUS | Status: DC | PRN

## 2020-01-09 MED ORDER — SODIUM CHLORIDE 0.9 % IV SOLN: INTRAVENOUS | Status: DC

## 2020-01-09 MED ORDER — PROPOFOL 10 MG/ML IV BOLUS
INTRAVENOUS | Status: DC | PRN
  Administered 2020-01-09: 40 mg via INTRAVENOUS

## 2020-01-09 MED ORDER — PROPOFOL 500 MG/50ML IV EMUL
INTRAVENOUS | Status: AC
  Filled 2020-01-09: qty 50

## 2020-01-09 MED ORDER — IOHEXOL 300 MG/ML  SOLN
100.0000 mL | Freq: Once | INTRAMUSCULAR | Status: AC | PRN
  Administered 2020-01-09: 100 mL via INTRAVENOUS

## 2020-01-09 MED ORDER — IOHEXOL 9 MG/ML PO SOLN
500.0000 mL | ORAL | Status: AC
  Administered 2020-01-09: 1000 mL via ORAL

## 2020-01-09 MED ORDER — PROPOFOL 500 MG/50ML IV EMUL
INTRAVENOUS | Status: DC | PRN
  Administered 2020-01-09: 150 ug/kg/min via INTRAVENOUS

## 2020-01-09 MED ORDER — LACTATED RINGERS IV SOLN: INTRAVENOUS | Status: DC

## 2020-01-09 MED ORDER — LIDOCAINE 2% (20 MG/ML) 5 ML SYRINGE
INTRAMUSCULAR | Status: DC | PRN
  Administered 2020-01-09: 80 mg via INTRAVENOUS

## 2020-01-09 SURGICAL SUPPLY — 14 items

## 2020-01-09 NOTE — Anesthesia Postprocedure Evaluation (Signed)
Anesthesia Post Note  Patient: Mark Buck  Procedure(s) Performed: ESOPHAGOGASTRODUODENOSCOPY (EGD) WITH PROPOFOL (N/A )     Patient location during evaluation: Endoscopy Anesthesia Type: MAC Level of consciousness: awake and alert Pain management: pain level controlled Vital Signs Assessment: post-procedure vital signs reviewed and stable Respiratory status: spontaneous breathing, nonlabored ventilation, respiratory function stable and patient connected to nasal cannula oxygen Cardiovascular status: blood pressure returned to baseline and stable Postop Assessment: no apparent nausea or vomiting Anesthetic complications: no   No complications documented.  Last Vitals:  Vitals:   01/09/20 0739 01/09/20 0856  BP: (!) 144/108 (!) 134/92  Pulse: 63 66  Resp: 20 (!) 22  Temp: 37.1 C   SpO2: 97% 98%    Last Pain:  Vitals:   01/09/20 0739  TempSrc: Oral  PainSc: 2                  Barnet Glasgow

## 2020-01-09 NOTE — Discharge Instructions (Signed)

## 2020-01-09 NOTE — Transfer of Care (Signed)
Immediate Anesthesia Transfer of Care Note  Patient: Mark Buck  Procedure(s) Performed: ESOPHAGOGASTRODUODENOSCOPY (EGD) WITH PROPOFOL (N/A )  Patient Location: Endoscopy Unit  Anesthesia Type:MAC  Level of Consciousness: awake, alert , oriented and patient cooperative  Airway & Oxygen Therapy: Patient Spontanous Breathing and Patient connected to face mask oxygen  Post-op Assessment: Report given to RN, Post -op Vital signs reviewed and stable and Patient moving all extremities  Post vital signs: Reviewed and stable  Last Vitals:  Vitals Value Taken Time  BP 128/88 01/09/20 0900  Temp    Pulse 64 01/09/20 0901  Resp 21 01/09/20 0901  SpO2 97 % 01/09/20 0901  Vitals shown include unvalidated device data.  Last Pain:  Vitals:   01/09/20 0739  TempSrc: Oral  PainSc: 2          Complications: No complications documented.

## 2020-01-09 NOTE — Op Note (Signed)
[facility] Patient Name: Mark Buck Procedure Date: 01/09/2020 MRN: [id number] Attending MD: Carol Ada , MD Date of Birth: 28-May-1965 CSN: [id number] Age: 54 Admit Type: Outpatient Procedure:                Upper GI endoscopy Indications:              Dysphagia, Weight loss Providers:                Carol Ada, MD, Glori Bickers, RN, Cherylynn Ridges,                            Technician, Moniteau Alday CRNA, CRNA Referring MD:              Medicines:                Propofol per Anesthesia Complications:            No immediate complications. Estimated Blood Loss:     Estimated blood loss was minimal. Procedure:                Pre-Anesthesia Assessment:                           - Prior to the procedure, a History and Physical                            was performed, and patient medications and                            allergies were reviewed. The patient's tolerance of                            previous anesthesia was also reviewed. The risks                            and benefits of the procedure and the sedation                            options and risks were discussed with the patient.                            All questions were answered, and informed consent                            was obtained. Prior Anticoagulants: The patient has                            taken no previous anticoagulant or antiplatelet                            agents. ASA Grade Assessment: II - A patient with                            mild systemic disease. After reviewing the risks  and benefits, the patient was deemed in                            satisfactory condition to undergo the procedure.                           - Sedation was administered by an anesthesia                            professional. Deep sedation was attained.                           After obtaining informed consent, the endoscope was                            passed  under direct vision. Throughout the                            procedure, the patient's blood pressure, pulse, and                            oxygen saturations were monitored continuously. The                            [id number]-H190 ([id number]) was introduced through the                            mouth, and advanced to the second part of duodenum.                            The upper GI endoscopy was accomplished without                            difficulty. The patient tolerated the procedure                            well. Scope In: Scope Out: Findings:      A large, fungating mass with no bleeding and no stigmata of recent       bleeding was found in the middle third of the esophagus and in the lower       third of the esophagus, 31 cm from the incisors. The mass was partially       obstructing and not circumferential. Biopsies were taken with a cold       forceps for histology.      There were esophageal mucosal changes classified as Barrett's stage       C8-M8 per Prague criteria present in the middle third of the esophagus       and in the lower third of the esophagus. The maximum longitudinal extent       of these mucosal changes was 8 cm in length.      A 3 cm hiatal hernia was present.      The stomach was normal.      The examined duodenum was normal.      A large eccentric mass was identified at 31 cm from  the incisors. This       mass was friable and it extended distally to the GE junction. There was       also a separate mass at the GE junction. There was no difficulty with       passing the endoscope through the esophagus. At 29 cm from the incisors,       there was gross evidence of a Barrett's esophagus. Multiple biopsies       were obtained. Impression:               - Partially obstructing, likely malignant                            esophageal tumor was found in the middle third of                            the esophagus and in the lower third of the                             esophagus. Biopsied.                           - Esophageal mucosal changes classified as                            Barrett's stage C8-M8 per Prague criteria.                           - 3 cm hiatal hernia.                           - Normal stomach.                           - Normal examined duodenum. Moderate Sedation:      Not Applicable - Patient had care per Anesthesia. Recommendation:           - Patient has a contact number available for                            emergencies. The signs and symptoms of potential                            delayed complications were discussed with the                            patient. Return to normal activities tomorrow.                            Written discharge instructions were provided to the                            patient.                           - Full liquid diet.                           -  Continue present medications.                           - Await pathology results.                           - CT scan of the Chest/ABD/Pelvis.                           - Oncology referral. Procedure Code(s):        --- Professional ---                           [id number], Esophagogastroduodenoscopy, flexible,                            transoral; with biopsy, single or multiple Diagnosis Code(s):        --- Professional ---                           D49.0, Neoplasm of unspecified behavior of                            digestive system                           K22.70, Barrett's esophagus without dysplasia                           K44.9, Diaphragmatic hernia without obstruction or                            gangrene                           R13.10, Dysphagia, unspecified                           R63.4, Abnormal weight loss CPT copyright 2019 [employer]. All rights reserved. The codes documented in this report are preliminary and upon coder review may  be revised to meet current compliance requirements. Carol Ada, MD Carol Ada, MD 01/09/2020 9:49:16 AM This report has been signed electronically. Number of Addenda: 0

## 2020-01-09 NOTE — H&P (Signed)
  Mark Buck HPI: This is a 54 year old male with complaints of dysphagia.  He statrted to experience the symptoms 3 weeks ago.  As a result he lost his appetite and lost weight from 213 lbs down to 183 lbs.  There is a solid food component to his symptoms.  He denies any issues with GERD or hematemesis.  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  Past Medical History:  Diagnosis Date  . Wears dentures     History reviewed. No pertinent surgical history.  History reviewed. No pertinent family history.  Social History:  reports that he has never smoked. He has never used smokeless tobacco. He reports that he does not drink alcohol and does not use drugs.  Allergies:  Allergies  Allergen Reactions  . Codeine Nausea And Vomiting    Medications:  Scheduled:  Continuous: . lactated ringers      No results found for this or any previous visit (from the past 24 hour(s)).   No results found.  ROS:  As stated above in the HPI otherwise negative.  Blood pressure (!) 144/108, pulse 63, temperature 98.7 F (37.1 C), temperature source Oral, resp. rate 20, height 5\' 9"  (1.753 m), weight 83 kg, SpO2 97 %.    PE: Gen: NAD, Alert and Oriented HEENT:  [place]/AT, EOMI Neck: Supple, no LAD Lungs: CTA Bilaterally CV: RRR without M/G/R ABD: Soft, NTND, +BS Ext: No C/C/E  Assessment/Plan: 1) Dysphagia. 2) History of trach. 3) Weight loss.  Plan: 1) EGD with dilation.  Mark Buck D 01/09/2020, 8:11 AM

## 2020-01-12 ENCOUNTER — Encounter (HOSPITAL_COMMUNITY): Payer: Self-pay | Admitting: Gastroenterology

## 2020-01-13 LAB — SURGICAL PATHOLOGY

## 2020-01-13 NOTE — Progress Notes (Signed)
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 He requested I call his wife to discuss.  I called and spoke to Marcie Bal his wife, I have given them an appointment on Thursday 9/30 at 3 pm with Dr. Burr Medico.  I asked them to arrive at least 15 to 20 minutes early to register.  She is very anxious and tearful over the phone.  I explained he will also need a consult with radiation oncology.  I have message Shona Simpson PA with Dr. Ida Rogue office.

## 2020-01-14 ENCOUNTER — Other Ambulatory Visit: Payer: Self-pay | Admitting: Radiation Oncology

## 2020-01-14 ENCOUNTER — Telehealth: Payer: Self-pay

## 2020-01-14 DIAGNOSIS — C155 Malignant neoplasm of lower third of esophagus: Secondary | ICD-10-CM

## 2020-01-14 NOTE — Telephone Encounter (Signed)
Ms vanscyoc called this am at 80.  I returned her call.  She states her husband is having difficulty swallowing and his urine is getting dark.  She wanted to know if he could be seen sooner than tomorrow at 1500.  I told Dr. Burr Medico does not have any openings today.  I also reviewed Dr. Ernestina Penna recommendation of going to [facility] ED.  She stated "then he is not being admitted".  I told her I don't know.  He needs to be seen in the ED for that to be determined. She verbalized understanding.

## 2020-01-14 NOTE — Progress Notes (Signed)
Patient's wife calls with concerns that her husband's difficulty swallowing is getting worse with every day.  At times cannot even get his own saliva down.  His urine is very dark.  I consulted with Dr. Burr Medico and her recommendation is to go to [facility] ED for possible admission and she will see her there.  She verbalized an understanding stating that her husband is very stubborn but she will try to convince her to go.  She will let me know.

## 2020-01-14 NOTE — Progress Notes (Signed)
GI Location of Tumor / Histology: Esophageal Adenocarcinoma- Middle and Lower third  Chavez C Pearman presented with 3 week history of dysphagia.  PET: Unscheduled  CT CAP 01/09/2020: 5 x 4 cm mid to lower esophageal mass with necrotic appearing paraesophageal and gastrohepatic ligament lymph nodes. 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 No findings suspicious for hepatic metastatic disease.  Small basilar pulmonary nodules, indeterminate. Attention on future scans is suggested.  Multiple bilateral rib lesions worrisome for metastatic disease.  Upper Endoscopy 01/09/2020: Large fungating mass with no bleeding and no stigmata of recent bleeding was fond in the middle third of the esophagus and in the lower third of the esophagus, 31 cm from the incisors.  The mass was partially obstructing and not circumferential.  Biopsies of Esophageal Mass 01/09/2020   Past/ Anticipated interventions by GI, if any: Dr. Benson Norway 01/09/2020 -Assessment/Plan: 1) Dysphagia. 2) History of trach. 3) Weight loss.  Plan: 1) EGD with dilation.    Past/Anticipated interventions by surgeon, if any:   Past/Anticipated interventions by medical oncology, if any:  Dr. Burr Medico 01/15/2020 3 pm    Weight changes, if any: 46 pound weight loss since about 12/13/2019  Bowel/Bladder complaints, if any: Urine is very dark.  No constipation noted.  Nausea / Vomiting, if any: No  Pain issues, if any:  With swallowing  Appetite: Afraid to eat, thinks it will get hung up in his throat.  Diet: Eating softer, thinner foods and liquids.   SAFETY ISSUES:  Prior radiation? No  Pacemaker/ICD? No  Possible current pregnancy? n/a  Is the patient on methotrexate? No  Current Complaints/Details:

## 2020-01-14 NOTE — Progress Notes (Signed)
I spoke with patient's wife at the request of Radiation Oncology and I spoke with the patient to clarify his current state.  He tells me he can drink thin liquids, able to eat Jello.  I explained that I gave them directions to go to the ED to be evaluated and possibly admitted because his wife had told me that his dysphagia is getting worse with each passing day and that at times he can't swallow his own saliva.  He agrees to go to the ED to be evaluated if he is unable to swallow even thin liquids.  He plans to keep his appointment tomorrow with Dr. Burr Medico and I told him to expect a call from Radiation Oncology regard a phone consult in the morning which he must be present for.  I then spoke to his wife to reiterate all of the above.  They both verbalized an understanding.

## 2020-01-14 NOTE — Progress Notes (Addendum)
[place]   [phone] Fax:([phone]) [place] Note   Patient Care Team: Patient, No Pcp Per as PCP - General (General Practice) Jonnie Finner, RN as Oncology Nurse Navigator Truitt Merle, MD as Consulting Physician (Oncology) Donald Prose, MD as Consulting Physician (Family Medicine) Carol Ada, MD as Consulting Physician (Gastroenterology)  Date of Service:  01/15/2020   CHIEF COMPLAINTS/PURPOSE OF CONSULTATION:  Newly Diagnosed Esophageal cancer   REFERRING PHYSICIAN:  Dr Benson Norway    HISTORY OF PRESENTING ILLNESS:  Mark Buck 54 y.o. male is a here because of newly diagnose Esophageal cancer. The patient was referred by Dr Benson Norway. The patient presents to the clinic today accompanied by his wife.   He started having dysphagia with solid food about 1 month ago, this has been gradually getting worse, he is currently only able to tolerate applesauce, chicken noodle, and liquid diet, he is able to drink fluids adequately. He has lost a total of 46 lbs in the past month. He also has mild odynophagia at his throat and mid chest, he describes the pain level is around 4-5, tolerable. No cough, choking, or shortness of breath. He has mild to moderate fatigue, still works full-time. He also noticed persistent headaches in the front, and some pain in the bilateral TMJ. He had incidental fall and probably injured his right-sided chest and hip area, he still has bruise in the right lateral thigh. He works in Paediatric nurse. No nausea, vomiting, abdominal distention or change of bowel habit.  He was seen by his primary care physician, and was subsequently referred to gastroenterologist Dr. Benson Norway. He underwent upper endoscopy on 01/09/2020 which showed a large, fungating mass with no bleeding found in the mid and low third of the esophagus, biopsy showed adenocarcinoma, poorly differentiated.    He has no significant past medical history, but also do  not see doctors routinely.  Socially, he lives with his wife in [place], [place], about 30 mins from our office. They have 2 adult children. His wife does not work.    REVIEW OF SYSTEMS:  Constitutional: Denies fevers, chills or abnormal night sweats, (+) mild fatigue and 46 pound weight loss in the past one month  Eyes: Denies blurriness of vision, double vision or watery eyes Ears, nose, mouth, throat, and face: Denies mucositis or sore throat Respiratory: Denies cough, dyspnea or wheezes Cardiovascular: Denies palpitation, chest discomfort or lower extremity swelling Gastrointestinal:  See HPI  Skin: Denies abnormal skin rashes Lymphatics: Denies new lymphadenopathy or easy bruising Neurological:Denies numbness, tingling or new weaknesses Behavioral/Psych: Mood is stable, no new changes  All other systems were reviewed with the patient and are negative.   MEDICAL HISTORY:  Past Medical History:  Diagnosis Date  . Wears dentures     SURGICAL HISTORY: Past Surgical History:  Procedure Laterality Date  . BIOPSY  01/09/2020   Procedure: BIOPSY;  Surgeon: Carol Ada, MD;  Location: [facility];  Service: Endoscopy;;  . ESOPHAGOGASTRODUODENOSCOPY (EGD) WITH PROPOFOL N/A 01/09/2020   Procedure: ESOPHAGOGASTRODUODENOSCOPY (EGD) WITH PROPOFOL;  Surgeon: Carol Ada, MD;  Location: [facility];  Service: Endoscopy;  Laterality: N/A;    SOCIAL HISTORY: Social History   Socioeconomic History  . Marital status: Married    Spouse name: Not on file  . Number of children: 2  . Years of education: Not on file  . Highest education level: Not on file  Occupational History    Comment: [employer]  Tobacco Use  . Smoking status: Never Smoker  . Smokeless tobacco: Never Used  Substance and Sexual Activity  . Alcohol use: No  . Drug use: No  . Sexual activity: Never  Other Topics Concern  . Not on file  Social History Narrative  . Not on file   Social Determinants of Health     Financial Resource Strain:   . Difficulty of Paying Living Expenses: Not on file  Food Insecurity:   . Worried About Charity fundraiser in the Last Year: Not on file  . Ran Out of Food in the Last Year: Not on file  Transportation Needs:   . Lack of Transportation (Medical): Not on file  . Lack of Transportation (Non-Medical): Not on file  Physical Activity:   . Days of Exercise per Week: Not on file  . Minutes of Exercise per Session: Not on file  Stress:   . Feeling of Stress : Not on file  Social Connections:   . Frequency of Communication with Friends and Family: Not on file  . Frequency of Social Gatherings with Friends and Family: Not on file  . Attends Religious Services: Not on file  . Active Member of Clubs or Organizations: Not on file  . Attends Archivist Meetings: Not on file  . Marital Status: Not on file  Intimate Partner Violence:   . Fear of Current or Ex-Partner: Not on file  . Emotionally Abused: Not on file  . Physically Abused: Not on file  . Sexually Abused: Not on file    FAMILY HISTORY: Family History  Problem Relation Age of Onset  . Thyroid cancer Sister   . Breast cancer Maternal Great-grandmother   . Cancer Maternal Grandfather        skin cancer   . Cancer Other        GGM breast cancer     ALLERGIES:  is allergic to codeine.  MEDICATIONS:  No current outpatient medications on file.   No current facility-administered medications for this visit.    PHYSICAL EXAMINATION: ECOG PERFORMANCE STATUS:   Vitals:   01/15/20 1452  BP: 138/87  Pulse: 71  Resp: 18  Temp: 97.9 F (36.6 C)  SpO2: 100%   Filed Weights   01/15/20 1452  Weight: 173 lb (78.5 kg)   GENERAL:alert, no distress and comfortable SKIN: skin color, texture, turgor are normal, no rashes or significant lesions EYES: normal, Conjunctiva are pink and non-injected, sclera clear NECK: supple, thyroid normal size, non-tender, without nodularity LYMPH:  no  palpable lymphadenopathy in the cervical, axillary  LUNGS: clear to auscultation and percussion with normal breathing effort HEART: regular rate & rhythm and no murmurs and no lower extremity edema ABDOMEN:abdomen soft, non-tender and normal bowel sounds Musculoskeletal:no cyanosis of digits and no clubbing  NEURO: alert & oriented x 3 with fluent speech, no focal motor/sensory deficits  LABORATORY DATA:  I have reviewed the data as listed CBC Latest Ref Rng & Units 01/15/2020  WBC 4.0 - 10.5 K/uL 7.1  Hemoglobin 13.0 - 17.0 g/dL 15.9  Hematocrit 39 - 52 % 47.2  Platelets 150 - 400 K/uL 259    CMP Latest Ref Rng & Units 01/15/2020  Glucose 70 - 99 mg/dL 96  BUN 6 - 20 mg/dL 8  Creatinine 0.61 - 1.24 mg/dL 0.88  Sodium 135 - 145 mmol/L 136  Potassium 3.5 - 5.1 mmol/L 3.5  Chloride 98 - 111 mmol/L 102  CO2 22 - 32 mmol/L 26  Calcium 8.9 - 10.3 mg/dL 9.2  Total Protein 6.5 - 8.1 g/dL 7.0  Total Bilirubin 0.3 - 1.2 mg/dL 1.4(H)  Alkaline Phos 38 - 126 U/L 202(H)  AST 15 - 41 U/L 20  ALT 0 - 44 U/L 21     EGD with Upper Endoscopy by Dr Benson Norway 01/09/20  IMPRESSION - Partially obstructing, likely malignant esophageal tumor was found in the middle third of the esophagus and in the lower third of the esophagus. Biopsied. 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 - 3 cm hiatal hernia. - Normal stomach. - Normal examined duodenum    FINAL MICROSCOPIC DIAGNOSIS: 01/09/20  A. ESOPHAGEAL MASS, BIOPSY:  - Adenocarcinoma arising in a background of high-grade dysplasia with  focal intestinal metaplasia  - See comment   COMMENT:  Based on the biopsy, the adenocarcinoma appears poorly differentiated.  Tissue is available for ancillary studies upon request. Dr. Benson Norway was  notified of these results on January 13, 2020. Dr. Vic Ripper reviewed  the case and agrees with the above diagnosis.      CT chest W contrast 01/09/20   IMPRESSION: 1. 5 x 4 cm mid to lower esophageal mass with necrotic appearing paraesophageal and gastrohepatic ligament lymph nodes. 2. 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 No findings suspicious for hepatic metastatic disease. 3. Small basilar pulmonary nodules, indeterminate. Attention on future scans is suggested. 4. Multiple bilateral rib lesions worrisome for metastatic disease. 5. Recommend PET-CT for further evaluation and staging. 6. Age advanced atherosclerotic calcifications involving the aorta and iliac arteries. 7. Aortic atherosclerosis. Aortic Atherosclerosis (ICD10-I70.0).   RADIOGRAPHIC STUDIES: I have personally reviewed the radiological images as listed and agreed with the findings in the report. CT CHEST ABDOMEN PELVIS W CONTRAST  Result Date: 01/09/2020 CLINICAL DATA:  Esophageal mass. Difficulty swallowing for 1 month. Esophageal mass found on endoscopy today. EXAM: CT CHEST, ABDOMEN, AND PELVIS WITH CONTRAST TECHNIQUE: Multidetector CT imaging of the chest, abdomen and pelvis was performed following the standard protocol during bolus administration of intravenous contrast. CONTRAST:  137mL OMNIPAQUE IOHEXOL 300 MG/ML  SOLN COMPARISON:  None. FINDINGS: CT CHEST FINDINGS Cardiovascular: The heart is normal in size. No pericardial effusion. The aorta is normal in caliber. Atherosclerotic calcifications are noted along the aortic arch. The branch vessels are patent. No obvious coronary artery calcifications. Mediastinum/Nodes: There is a mid to lower esophageal mass with irregular wall thickening measuring approximately 5 cm in the cephalocaudal dimension and 4 cm in the transverse plane. There is a necrotic appearing 13 mm node posterior to the aorta on image 33/2. There is also a small paraesophageal node on image 40/2 which measures 5.5 mm. Right hilar node measures 9 mm on image 29/2 and 6.5 mm subcarinal lymph node on image 29/2. Lungs/Pleura:  Indeterminate 5 mm pulmonary nodule noted at the left lung base on image number 128/4. There is also a 5 mm nodule at the right lung base on image number 135/4. 3 mm subpleural nodule in the right lower lobe on image number 118/4 No other pulmonary nodules or worrisome pulmonary lesions. Musculoskeletal: There is a healing third lateral rib fracture. There is also a lytic lesion involving the anterior aspect of the right seventh rib and a possible healing lesion involving the right ninth rib. There is a mixed lytic and sclerotic lesion involving the left second anterior rib, the healing lesion involving the left eighth lateral rib and a cortical lesion involving the left lateral  tenth rib. Findings certainly very worrisome for metastatic disease. No obvious sternal or vertebral body lesions. CT ABDOMEN PELVIS FINDINGS Hepatobiliary: There are 4 hepatic lesions. 3 cm lesion in segment 2 is a benign hemangioma. 6 cm lesion in segment 7 is also benign hemangioma. Two small simple appearing cysts are noted in segment 5 and segment 7. I do not see any lesions suspicious for hepatic metastatic disease. Pancreas: No mass, inflammation or ductal dilatation. Spleen: Normal size.  No focal lesions. Adrenals/Urinary Tract: The adrenal glands and kidneys are unremarkable. The bladder is unremarkable. Stomach/Bowel: The stomach, duodenum, small bowel and colon are unremarkable. Vascular/Lymphatic: Age advanced atherosclerotic calcifications involving the aorta and iliac arteries but no aneurysm or dissection. The branch vessels are patent. Small gastrohepatic ligament lymph nodes are worrisome for neoplastic adenopathy. The largest node measures 13 mm on image 60/2. No mesenteric or retroperitoneal adenopathy. Reproductive: The prostate gland and seminal vesicles are unremarkable. Other: No pelvic mass or adenopathy. No free pelvic fluid collections. No inguinal mass or adenopathy. No abdominal wall hernia or subcutaneous  lesions. Musculoskeletal: No lumbar vertebral body lesions are identified. Suspect small lesion in the left iliac bone on image 98/2. IMPRESSION: 1. 5 x 4 cm mid to lower esophageal mass with necrotic appearing paraesophageal and gastrohepatic ligament lymph nodes. 2. 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 No findings suspicious for hepatic metastatic disease. 3. Small basilar pulmonary nodules, indeterminate. Attention on future scans is suggested. 4. Multiple bilateral rib lesions worrisome for metastatic disease. 5. Recommend PET-CT for further evaluation and staging. 6. Age advanced atherosclerotic calcifications involving the aorta and iliac arteries. 7. Aortic atherosclerosis. Aortic Atherosclerosis (ICD10-I70.0). Electronically Signed   By: Marijo Sanes M.D.   On: 01/09/2020 16:07    ASSESSMENT & PLAN:  SHAIDEN ALDOUS is a 54 y.o. Caucasian male without significant PMH presents dysphagia and weight loss   1. Mid-low esophageal adenocarcinoma, poorly differentiated, [id number] -I reviewed his endoscopy findings, biopsy results, with patient and his wife in details. The tumor in the mid and low esophagus is causing significant obstruction, Dr. Benson Norway was not able to pass the scope. This is likely locally advanced disease. -We reviewed his staging CT scanning image in person, and I discussed he has had recent fall and injury to his right side, so the bone lesions in his right ribs are likely related to the trauma. Multiple liver lesions to be hemangioma, no other definitive evidence of distant metastasis on CT. -Plan to proceed with PET scan for further staging, if it is negative for distant metastasis, I do not feel he needs EUS for staging, this is likely at least T3 lesion  -due to his headaches, rad/onc has ordered brain MRI  -If PET shows possible distant metastasis, will consider further biopsy -I discussed standard treatment for locally advanced esophageal cancer, which  includes neoadjuvant concurrent chemoradiation with weekly carboplatin and paclitaxel for 5 to 6 weeks, esophagectomy, and adjuvant nivolumab for 1 year with residual disease on surgical sample.  -He is young and fit, will be a good candidate for intensive treatment. --Chemotherapy consent: Side effects including but does not not limited to, fatigue, nausea, vomiting, diarrhea, hair loss, neuropathy, fluid retention, renal and kidney dysfunction, neutropenic fever, needed for blood transfusion, bleeding, were discussed with patient in great detail.He agrees to proceed. -plan to start treatment in a few weeks   2. Dysphagia, odynophagia, significant weight loss, protein and calorie malnutrition -Secondary to his newly diagnosed esophageal cancer -  We reviewed nutrition supplement, will give him samples of nutrition supplements today -urgent dietician referral -will refer him to cardiothoracic surgeon Dr. Kipp Brood for evaluation and possible J-tube placement    PLAN:  -lab today and IVF  -urgent dietician referral  -urgent referral to cardiothoracic surgeon Dr. Kipp Brood -PET and brain MRI are scheduled for next week  -chemo class next week -f/u after above workup    Orders Placed This Encounter  Procedures  . Ferritin    Standing Status:   Future    Number of Occurrences:   1    Standing Expiration Date:   01/14/2021  . Iron and TIBC    Standing Status:   Future    Number of Occurrences:   1    Standing Expiration Date:   01/14/2021  . [facility] (IN HOUSE-[facility])    Standing Status:   Future    Number of Occurrences:   1    Standing Expiration Date:   01/14/2021  . Ambulatory referral to Cardiothoracic Surgery    Referral Priority:   Urgent    Referral Type:   Surgical    Referral Reason:   Specialty Services Required    Requested Specialty:   Cardiothoracic Surgery    Number of Visits Requested:   1    All questions were answered. The patient knows to call the clinic with any  problems, questions or concerns. The total time spent in the appointment was 60 minutes.     Truitt Merle, MD 01/15/2020   I, Joslyn Devon, am acting as scribe for Truitt Merle, MD.   I have reviewed the above documentation for accuracy and completeness, and I agree with the above.

## 2020-01-15 ENCOUNTER — Other Ambulatory Visit: Payer: Self-pay

## 2020-01-15 ENCOUNTER — Ambulatory Visit
Admission: RE | Admit: 2020-01-15 | Discharge: 2020-01-15 | Disposition: A | Discharge status: Home / Self Care | Payer: No Typology Code available for payment source | Source: Ambulatory Visit | Attending: Radiation Oncology | Admitting: Radiation Oncology

## 2020-01-15 ENCOUNTER — Encounter: Payer: Self-pay | Admitting: Hematology

## 2020-01-15 ENCOUNTER — Inpatient Hospital Stay: Payer: No Typology Code available for payment source

## 2020-01-15 ENCOUNTER — Encounter: Payer: Self-pay | Admitting: General Practice

## 2020-01-15 ENCOUNTER — Inpatient Hospital Stay: Payer: No Typology Code available for payment source | Attending: Hematology | Admitting: Hematology

## 2020-01-15 ENCOUNTER — Encounter: Payer: Self-pay | Admitting: Radiation Oncology

## 2020-01-15 VITALS — BP 138/87 | HR 71 | Temp 97.9°F | Resp 18 | Ht 69.0 in | Wt 173.0 lb

## 2020-01-15 DIAGNOSIS — C158 Malignant neoplasm of overlapping sites of esophagus: Secondary | ICD-10-CM

## 2020-01-15 DIAGNOSIS — K2270 Barrett's esophagus without dysplasia: Secondary | ICD-10-CM | POA: Insufficient documentation

## 2020-01-15 DIAGNOSIS — E46 Unspecified protein-calorie malnutrition: Secondary | ICD-10-CM | POA: Diagnosis not present

## 2020-01-15 LAB — CBC WITH DIFFERENTIAL (CANCER CENTER ONLY)
Abs Immature Granulocytes: 0.02 10*3/uL (ref 0.00–0.07)
Basophils Absolute: 0.1 10*3/uL (ref 0.0–0.1)
Basophils Relative: 1 %
Eosinophils Absolute: 0.1 10*3/uL (ref 0.0–0.5)
Eosinophils Relative: 1 %
HCT: 47.2 % (ref 39.0–52.0)
Hemoglobin: 15.9 g/dL (ref 13.0–17.0)
Immature Granulocytes: 0 %
Lymphocytes Relative: 19 %
Lymphs Abs: 1.4 10*3/uL (ref 0.7–4.0)
MCH: 30 pg (ref 26.0–34.0)
MCHC: 33.7 g/dL (ref 30.0–36.0)
MCV: 89.1 fL (ref 80.0–100.0)
Monocytes Absolute: 0.6 10*3/uL (ref 0.1–1.0)
Monocytes Relative: 9 %
Neutro Abs: 4.9 10*3/uL (ref 1.7–7.7)
Neutrophils Relative %: 70 %
Platelet Count: 259 10*3/uL (ref 150–400)
RBC: 5.3 MIL/uL (ref 4.22–5.81)
RDW: 12.4 % (ref 11.5–15.5)
WBC Count: 7.1 10*3/uL (ref 4.0–10.5)
nRBC: 0 % (ref 0.0–0.2)

## 2020-01-15 LAB — CMP (CANCER CENTER ONLY)
ALT: 21 U/L (ref 0–44)
AST: 20 U/L (ref 15–41)
Albumin: 3.9 g/dL (ref 3.5–5.0)
Alkaline Phosphatase: 202 U/L — ABNORMAL HIGH (ref 38–126)
Anion gap: 8 (ref 5–15)
BUN: 8 mg/dL (ref 6–20)
CO2: 26 mmol/L (ref 22–32)
Calcium: 9.2 mg/dL (ref 8.9–10.3)
Chloride: 102 mmol/L (ref 98–111)
Creatinine: 0.88 mg/dL (ref 0.61–1.24)
GFR, Est AFR Am: >60 mL/min (ref >60)
GFR, Estimated: >60 mL/min (ref >60)
Glucose, Bld: 96 mg/dL (ref 70–99)
Potassium: 3.5 mmol/L (ref 3.5–5.1)
Sodium: 136 mmol/L (ref 135–145)
Total Bilirubin: 1.4 mg/dL — ABNORMAL HIGH (ref 0.3–1.2)
Total Protein: 7 g/dL (ref 6.5–8.1)

## 2020-01-15 MED ORDER — SODIUM CHLORIDE 0.9 % IV SOLN
Freq: Once | INTRAVENOUS | Status: AC
  Filled 2020-01-15: qty 250

## 2020-01-15 NOTE — Progress Notes (Signed)
[facility] Psychosocial Distress Screening Clinical Social Work  Clinical Social Work was referred by distress screening protocol.  The patient scored a 5 on the Psychosocial Distress Thermometer which indicates moderate distress. Clinical Social Worker contacted patient by phone to assess for distress and other psychosocial needs. Patient unavailable, spoke w wife. She is very overwhelmed by the process of diagnosis and prospect of treatment - wants to be supportive but is also concerned about finances as he may not be able to continue to work.  Has physical job in Architect.  He verbalizes determination to continue to work throughout treatment, she is not sure he will be able to do so.  Early diagnosis period has been a roller coaster, she is finding it difficult to get her feet underneath her as information changes.  Encouraged taking things one day at a time, exploring resources available.  Referred to [employer] as financial distress is a concern.   ONCBCN DISTRESS SCREENING 01/15/2020  Screening Type Initial Screening  Distress experienced in past week (1-10) 5  Emotional problem type Nervousness/Anxiety;Adjusting to illness  Information Concerns Type Lack of info about treatment;Lack of info about diagnosis  Other Contact via phone.    Clinical Social Worker follow up needed: Yes.    Will need follow up call when established in treatment.  Please reconsult.    If yes, follow up plan:  Beverely Pace, [place], LCSW Clinical Social Worker Phone:  [phone]

## 2020-01-15 NOTE — Patient Instructions (Signed)

## 2020-01-15 NOTE — Progress Notes (Signed)
Radiation Oncology         (336) [phone] ________________________________  Initial Outpatient Consultation - Conducted via telephone due to current COVID-19 concerns for limiting patient exposure  I spoke with the patient to conduct this consult visit via telephone to spare the patient unnecessary potential exposure in the healthcare setting during the current COVID-19 pandemic. The patient was notified in advance and was offered a [place] meeting to allow for face to face communication but unfortunately reported that they did not have the appropriate resources/technology to support such a visit and instead preferred to proceed with a telephone consult.   Name: Mark Buck        MRN: [id number]  Date of Service: 01/15/2020 DOB: Mar 16, 1966  XT:GGYIRSW, No Pcp Per  Carol Ada, MD     REFERRING PHYSICIAN: Carol Ada, MD   DIAGNOSIS: The encounter diagnosis was Primary adenocarcinoma of overlapping sites of esophagus [facility]).   HISTORY OF PRESENT ILLNESS: Mark Buck is a 54 y.o. male seen at the request of Dr. Benson Norway for newly diagnosed adenocarcinoma of the esophagus.  The patient had been experiencing solid food dysphagia only for about 3 weeks, and was seen by GI and underwent endoscopy on 01/09/2020.  There, Dr. Benson Norway found a large fungating mass with no bleeding or stigmata of recent bleeding in the middle third of the esophagus and into the lower third of the esophagus 31 cm from the incisors.  There were esophageal mucosal changes classified as Barrett's stage C 8 to emanate in the middle third of the esophagus and in the lower third of the esophagus, the longitudinal length of the mucosal surface changes was 8 cm, a biopsy of the esophageal mass was consistent with adenocarcinoma arising in a background of high-grade dysplasia with focal intestinal metaplasia that was poorly differentiated.  He has also undergone a CT scan of the chest abdomen and pelvis on 01/09/2020 which revealed  concerns for a 5 x 4 cm mid to lower esophageal mass with necrotic appearing lymph nodes in the paraesophageal and gastrohepatic ligament regions.  There were 4 benign appearing lesions in the liver consistent with hemangiomatous and cystic change.  There were small basilar pulmonary nodules that were felt to be indeterminate, and multiple bilateral rib lesions worrisome for metastatic disease.  He also had atherosclerotic calcifications in the aorta and iliac arteries.  A PET scan has been ordered but is still awaiting scheduling due to authorization.  He is contacted today to discuss options of treatment for his cancer and sees Dr. Burr Medico later this afternoon.   PREVIOUS RADIATION THERAPY: No   PAST MEDICAL HISTORY:  Past Medical History:  Diagnosis Date  . Wears dentures        PAST SURGICAL HISTORY: Past Surgical History:  Procedure Laterality Date  . BIOPSY  01/09/2020   Procedure: BIOPSY;  Surgeon: Carol Ada, MD;  Location: [facility];  Service: Endoscopy;;  . ESOPHAGOGASTRODUODENOSCOPY (EGD) WITH PROPOFOL N/A 01/09/2020   Procedure: ESOPHAGOGASTRODUODENOSCOPY (EGD) WITH PROPOFOL;  Surgeon: Carol Ada, MD;  Location: [facility];  Service: Endoscopy;  Laterality: N/A;     FAMILY HISTORY: No family history on file.   SOCIAL HISTORY:  reports that he has never smoked. He has never used smokeless tobacco. He reports that he does not drink alcohol and does not use drugs. The patient is married and lives in [place], [place]. He works Architect.    ALLERGIES: Codeine   MEDICATIONS:  No current outpatient medications on file.  No current facility-administered medications for this encounter.     REVIEW OF SYSTEMS: On review of systems, the patient reports that he is feeling poorly overall. He has had about 50 pounds of weight loss unintentionally in the last 2 months.  He describes pain in his mid chest after swallowing and that this really began a few months ago and would  resolve with tums and clasping his chest. On 12/13/19 however he stopped being able to eat solid foods and has only been able to tolerate liquids, with rare exception, but even applesauce makes him regurgitate. He has had headaches for a few weeks with and was not denies any chest pain, shortness of breath, cough, fevers, chills, night sweats. His urine is quite dark. He  He denies any bowel disturbances, and denies abdominal pain, nausea or vomiting. He denies any new musculoskeletal or joint aches or pains, but has had multiple injuries over the years including a head injury as an infant requiring tracheostomy, multiple falls as an adult working in Architect, and a known history of rib fractures. A complete review of systems is obtained and is otherwise negative.     PHYSICAL EXAM:  Unable to assess due to encounter    ECOG = 1  0 - Asymptomatic (Fully active, able to carry on all predisease activities without restriction)  1 - Symptomatic but completely ambulatory (Restricted in physically strenuous activity but ambulatory and able to carry out work of a light or sedentary nature. For example, light housework, office work)  2 - Symptomatic, <50% in bed during the day (Ambulatory and capable of all self care but unable to carry out any work activities. Up and about more than 50% of waking hours)  3 - Symptomatic, >50% in bed, but not bedbound (Capable of only limited self-care, confined to bed or chair 50% or more of waking hours)  4 - Bedbound (Completely disabled. Cannot carry on any self-care. Totally confined to bed or chair)  5 - Death   Eustace Pen MM, Creech RH, Tormey DC, et al. [phone]). "Toxicity and response criteria of the [facility]". [place] Oncol. 5 (6): 649-55    LABORATORY DATA:  No results found for: WBC, HGB, HCT, MCV, PLT No results found for: NA, K, CL, CO2 No results found for: ALT, AST, GGT, ALKPHOS, BILITOT    RADIOGRAPHY: CT CHEST  ABDOMEN PELVIS W CONTRAST  Result Date: 01/09/2020 CLINICAL DATA:  Esophageal mass. Difficulty swallowing for 1 month. Esophageal mass found on endoscopy today. EXAM: CT CHEST, ABDOMEN, AND PELVIS WITH CONTRAST TECHNIQUE: Multidetector CT imaging of the chest, abdomen and pelvis was performed following the standard protocol during bolus administration of intravenous contrast. CONTRAST:  140mL OMNIPAQUE IOHEXOL 300 MG/ML  SOLN COMPARISON:  None. FINDINGS: CT CHEST FINDINGS Cardiovascular: The heart is normal in size. No pericardial effusion. The aorta is normal in caliber. Atherosclerotic calcifications are noted along the aortic arch. The branch vessels are patent. No obvious coronary artery calcifications. Mediastinum/Nodes: There is a mid to lower esophageal mass with irregular wall thickening measuring approximately 5 cm in the cephalocaudal dimension and 4 cm in the transverse plane. There is a necrotic appearing 13 mm node posterior to the aorta on image 33/2. There is also a small paraesophageal node on image 40/2 which measures 5.5 mm. Right hilar node measures 9 mm on image 29/2 and 6.5 mm subcarinal lymph node on image 29/2. Lungs/Pleura: Indeterminate 5 mm pulmonary nodule noted at the left lung base  on image number 128/4. There is also a 5 mm nodule at the right lung base on image number 135/4. 3 mm subpleural nodule in the right lower lobe on image number 118/4 No other pulmonary nodules or worrisome pulmonary lesions. Musculoskeletal: There is a healing third lateral rib fracture. There is also a lytic lesion involving the anterior aspect of the right seventh rib and a possible healing lesion involving the right ninth rib. There is a mixed lytic and sclerotic lesion involving the left second anterior rib, the healing lesion involving the left eighth lateral rib and a cortical lesion involving the left lateral tenth rib. Findings certainly very worrisome for metastatic disease. No obvious sternal or  vertebral body lesions. CT ABDOMEN PELVIS FINDINGS Hepatobiliary: There are 4 hepatic lesions. 3 cm lesion in segment 2 is a benign hemangioma. 6 cm lesion in segment 7 is also benign hemangioma. Two small simple appearing cysts are noted in segment 5 and segment 7. I do not see any lesions suspicious for hepatic metastatic disease. Pancreas: No mass, inflammation or ductal dilatation. Spleen: Normal size.  No focal lesions. Adrenals/Urinary Tract: The adrenal glands and kidneys are unremarkable. The bladder is unremarkable. Stomach/Bowel: The stomach, duodenum, small bowel and colon are unremarkable. Vascular/Lymphatic: Age advanced atherosclerotic calcifications involving the aorta and iliac arteries but no aneurysm or dissection. The branch vessels are patent. Small gastrohepatic ligament lymph nodes are worrisome for neoplastic adenopathy. The largest node measures 13 mm on image 60/2. No mesenteric or retroperitoneal adenopathy. Reproductive: The prostate gland and seminal vesicles are unremarkable. Other: No pelvic mass or adenopathy. No free pelvic fluid collections. No inguinal mass or adenopathy. No abdominal wall hernia or subcutaneous lesions. Musculoskeletal: No lumbar vertebral body lesions are identified. Suspect small lesion in the left iliac bone on image 98/2. IMPRESSION: 1. 5 x 4 cm mid to lower esophageal mass with necrotic appearing paraesophageal and gastrohepatic ligament lymph nodes. 2. 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 No findings suspicious for hepatic metastatic disease. 3. Small basilar pulmonary nodules, indeterminate. Attention on future scans is suggested. 4. Multiple bilateral rib lesions worrisome for metastatic disease. 5. Recommend PET-CT for further evaluation and staging. 6. Age advanced atherosclerotic calcifications involving the aorta and iliac arteries. 7. Aortic atherosclerosis. Aortic Atherosclerosis (ICD10-I70.0). Electronically Signed   By:  Marijo Sanes M.D.   On: 01/09/2020 16:07       IMPRESSION/PLAN: 1. At least locally advanced, poorly differentiated adenocarcinoma overlapping the middle and distal third of the esophagus.  Dr. Lisbeth Renshaw discusses the findings and work-up thus far.  He recommends a PET scan specifically to evaluate for metastatic disease and for the purposes of utilizing this imaging for planning options of radiotherapy.  It is concerning that he has rib lesion seen on CT scan, and her PET scan plans will hopefully that this out further. With his symptoms of headaches, we recommend an MRI brain to rule out metastatic disease.  If he does not have metastatic disease, options would be chemoradiation over 5 1/2 weeks.  Dr. Lisbeth Renshaw discusses the options as well for surgical resection following chemoradiation and that this would be standard procedure for offering curative based therapy. The alternative would be a palliative course of treatment if he had metastatic disease. He will come in to see Dr. Burr Medico today and we will make plans for treatment based on his PET and MRI imaging and reach back out to the patient as soon as we have these results.  2.  Clinical dehydration. The patient appears to be clinically dehydrated based on his reported poor intake and tea colored urine. We will set up 1L NS over an hour in our infusion suite prior to seeing Dr. Burr Medico and have labs prior to his fluid administration.  3. Protein calorie malnutrition. The patient has significant weight loss in the last 2 months and is struggling with oral intake. We recommend he have a Jejunostomy tube for enteral feeding and have sent an urgent referral to general surgery for placement. He is in agreement with this plan.  Given current concerns for patient exposure during the COVID-19 pandemic, this encounter was conducted via telephone.  The patient has provided two factor identification and has given verbal consent for this type of encounter and has been advised  to only accept a meeting of this type in a secure network environment. The time spent during this encounter was 60 minutes including preparation, discussion, and coordination of the patient's care. The attendants for this meeting include Blenda Nicely, RN, Dr. Lisbeth Renshaw, Hayden Pedro  and Dione Housekeeper and his wife Shana Younge.  During the encounter,  Blenda Nicely, RN, Dr. Lisbeth Renshaw, and Hayden Pedro were located at [facility] Radiation Oncology Department.  Dione Housekeeper and his wife Divit Stipp were located at home.   The above documentation reflects my direct findings during this shared patient visit. Please see the separate note by Dr. Lisbeth Renshaw on this date for the remainder of the patient's plan of care.    Carola Rhine, PAC

## 2020-01-15 NOTE — Progress Notes (Signed)
Met with patient and his wife Marcie Bal today at initial medical oncology consult with Dr. Burr Medico.  I once again explained my role as nurse navigator and they were given my card with my direct contact information.  I encouraged them to call with any questions or concerns.  I obtained samples of Ensure and Boost along with some coupons to try.  They are to let me know if he likes any of it. I have referred them to our dietician for consult as he has had significant weight loss.

## 2020-01-15 NOTE — Progress Notes (Signed)
Patient referred to dietician, 50 pound weight loss in 3 months, was given samples of Ensure and Boost to try.

## 2020-01-16 ENCOUNTER — Institutional Professional Consult (permissible substitution) (INDEPENDENT_AMBULATORY_CARE_PROVIDER_SITE_OTHER): Payer: No Typology Code available for payment source | Admitting: Thoracic Surgery (Cardiothoracic Vascular Surgery)

## 2020-01-16 ENCOUNTER — Encounter (HOSPITAL_COMMUNITY): Payer: Self-pay | Admitting: Vascular Surgery

## 2020-01-16 ENCOUNTER — Encounter: Payer: Self-pay | Admitting: Thoracic Surgery (Cardiothoracic Vascular Surgery)

## 2020-01-16 ENCOUNTER — Other Ambulatory Visit (HOSPITAL_COMMUNITY)
Admission: RE | Admit: 2020-01-16 | Discharge: 2020-01-16 | Disposition: A | Discharge status: Home / Self Care | Payer: No Typology Code available for payment source | Source: Ambulatory Visit | Attending: Thoracic Surgery (Cardiothoracic Vascular Surgery) | Admitting: Thoracic Surgery (Cardiothoracic Vascular Surgery)

## 2020-01-16 ENCOUNTER — Encounter (HOSPITAL_COMMUNITY): Payer: Self-pay | Admitting: Thoracic Surgery (Cardiothoracic Vascular Surgery)

## 2020-01-16 ENCOUNTER — Other Ambulatory Visit: Payer: Self-pay | Admitting: *Deleted

## 2020-01-16 VITALS — BP 119/76 | HR 74 | Temp 99.0°F | Resp 20 | Ht 69.0 in | Wt 182.0 lb

## 2020-01-16 DIAGNOSIS — C158 Malignant neoplasm of overlapping sites of esophagus: Secondary | ICD-10-CM

## 2020-01-16 DIAGNOSIS — Z20822 Contact with and (suspected) exposure to covid-19: Secondary | ICD-10-CM | POA: Insufficient documentation

## 2020-01-16 DIAGNOSIS — Z01812 Encounter for preprocedural laboratory examination: Secondary | ICD-10-CM | POA: Diagnosis present

## 2020-01-16 DIAGNOSIS — C159 Malignant neoplasm of esophagus, unspecified: Secondary | ICD-10-CM

## 2020-01-16 LAB — IRON AND TIBC
Iron: 75 ug/dL (ref 42–163)
Saturation Ratios: 19 % — ABNORMAL LOW (ref 20–55)
TIBC: 396 ug/dL (ref 202–409)
UIBC: 321 ug/dL (ref 117–376)

## 2020-01-16 LAB — SARS CORONAVIRUS 2 (TAT 6-24 HRS): SARS Coronavirus 2: NEGATIVE

## 2020-01-16 LAB — FERRITIN: Ferritin: 74 ng/mL (ref 24–336)

## 2020-01-16 LAB — CEA (IN HOUSE-CHCC): CEA (CHCC-In House): 2.04 ng/mL (ref 0.00–5.00)

## 2020-01-16 NOTE — Progress Notes (Signed)
Spoke with wife Mark Buck cell [phone] PAT information.  PCP - [facility] Cardiologist - n/a  Chest x-ray - n/a, CT Chest 01/09/20 EKG - n/a Stress Test - n/a ECHO - n/a Cardiac Cath - n/a  STOP now taking any Aspirin (unless otherwise instructed by your surgeon), Aleve, Naproxen, Ibuprofen, Motrin, Advil, Goody's, BC's, all herbal medications, fish oil, and all vitamins.   Coronavirus Screening Covid test scheduled 01/16/20 Do you have any of the following symptoms:  Cough yes/no: No Fever (>100.54F)  yes/no: No Runny nose yes/no: No Sore throat yes/no: No Difficulty breathing/shortness of breath  yes/no: No  Have you traveled in the last 14 days and where? yes/no: No  Wife Mark Buck verbalized understanding of instructions that were given via phone.

## 2020-01-16 NOTE — Progress Notes (Signed)
[address]       [place] [address]             [phone]                    Trygg C Schaumburg  Medical Record #[id number] Date of Birth: 1965/08/26  Referring: Truitt Merle, MD Primary Care: Patient, No Pcp Per Primary Cardiologist: No primary care provider on file.  Chief Complaint:    Chief Complaint  Patient presents with  . Esophageal Cancer    newly dx esophageal cancer, upper endo 01/09/20    History of Present Illness:    Mark Buck 54 y.o. male who presents for evaluation of a newly diagnosed esophageal adenocarcinoma.  Patient states that in August she started developing some dysphagia which has progressed and only being able to tolerate liquids.  He has lost approximately 46 pounds since that point.  He does have a long history of reflux.  On review of systems he does complain of some headache    Smoking Hx: Never smoker.   Zubrod Score: At the time of surgery this patient's most appropriate activity status/level should be described as: [x]     0    Normal activity, no symptoms []     1    Restricted in physical strenuous activity but ambulatory, able to do out light work []     2    Ambulatory and capable of self care, unable to do work activities, up and about               >50 % of waking hours                              []     3    Only limited self care, in bed greater than 50% of waking hours []     4    Completely disabled, no self care, confined to bed or chair []     5    Moribund   Past Medical History:  Diagnosis Date  . Wears dentures     Past Surgical History:  Procedure Laterality Date  . BIOPSY  01/09/2020   Procedure: BIOPSY;  Surgeon: Carol Ada, MD;  [facility]: [facility];  Service: Endoscopy;;  . ESOPHAGOGASTRODUODENOSCOPY (EGD) WITH PROPOFOL N/A 01/09/2020   Procedure: ESOPHAGOGASTRODUODENOSCOPY (EGD) WITH PROPOFOL;  Surgeon: Carol Ada, MD;  Location: [facility];  Service: Endoscopy;  Laterality:  N/A;    Family History  Problem Relation Age of Onset  . Thyroid cancer Sister   . Breast cancer Maternal Great-grandmother   . Cancer Maternal Grandfather        skin cancer   . Cancer Other        GGM breast cancer      Social History   Tobacco Use  Smoking Status Never Smoker  Smokeless Tobacco Never Used    Social History   Substance and Sexual Activity  Alcohol Use No     Allergies  Allergen Reactions  . Codeine Nausea And Vomiting    No current outpatient medications on file.   No current facility-administered medications for this visit.    Review of Systems  Constitutional: Positive for malaise/fatigue and weight loss.  Respiratory: Negative for cough and shortness of breath.   Cardiovascular: Negative for chest pain.  Gastrointestinal: Positive for abdominal pain and heartburn.  Musculoskeletal: Positive for joint pain and  myalgias.  Neurological: Positive for headaches.     PHYSICAL EXAMINATION: BP 119/76   Pulse 74   Temp 99 F (37.2 C)   Resp 20   Ht 5\' 9"  (1.753 m)   Wt 182 lb (82.6 kg) Comment: Pt wearing steel toe boots at time of weight  SpO2 96% Comment: RA with mask on  BMI 26.88 kg/m  Physical Exam Constitutional:      General: He is not in acute distress.    Appearance: Normal appearance. He is ill-appearing. He is not toxic-appearing.  HENT:     Head: Normocephalic and atraumatic.  Eyes:     Extraocular Movements: Extraocular movements intact.     Conjunctiva/sclera: Conjunctivae normal.  Cardiovascular:     Rate and Rhythm: Normal rate.  Pulmonary:     Effort: Pulmonary effort is normal. No respiratory distress.  Abdominal:     General: Abdomen is flat.     Palpations: Abdomen is soft.  Musculoskeletal:        General: Normal range of motion.     Cervical back: Normal range of motion.  Skin:    General: Skin is warm and dry.  Neurological:     General: No focal deficit present.     Mental Status: He is alert and  oriented to person, place, and time.     Diagnostic Studies & Laboratory data:     PET/CT: Pending EGD/EUS: A large, fungating mass with no bleeding and no stigmata of recent bleeding was found in the middle third of the esophagus and in the lower third of the esophagus, 31 cm from the incisors. The mass was partially obstructing and not circumferential. Biopsies were taken with a cold forceps for histology. Findings: There were esophageal mucosal changes classified as Barrett's stage C8-M8 per [place] criteria present in the middle third of the esophagus and in the lower third of the esophagus. The maximum longitudinal extent of these mucosal changes was 8 cm in length. A 3 cm hiatal hernia was present.      Path: A. ESOPHAGEAL MASS, BIOPSY:  - Adenocarcinoma arising in a background of high-grade dysplasia with  focal intestinal metaplasia       I have independently reviewed the above radiology studies  and reviewed the findings with the patient.   Recent Lab Findings: Lab Results  Component Value Date   WBC 7.1 01/15/2020   HGB 15.9 01/15/2020   HCT 47.2 01/15/2020   PLT 259 01/15/2020   GLUCOSE 96 01/15/2020   ALT 21 01/15/2020   AST 20 01/15/2020   NA 136 01/15/2020   K 3.5 01/15/2020   CL 102 01/15/2020   CREATININE 0.88 01/15/2020   BUN 8 01/15/2020   CO2 26 01/15/2020      Problem List: Adenocarcinoma of the esophagus arising 31 cm from the incisors Significant obstruction this tumor leading to weight loss and malnutrition. Lytic lesions the rib     Assessment / Plan:   54 year old male with biopsy-proven adenocarcinoma of the mid to lower esophagus to tolerate any solids.  Discussed the potential placing a laparoscopic jejunostomy tube to ensure adequate nutrition and weight gain during his neoadjuvant treatment course.  He is tentatively scheduled for 01/19/2020.  The risks and benefits of been discussed, and he is agreeable to proceed.  In regards  to his esophageal cancer he will require a PET/CT, and an MRI brain given his been having some headaches.  In regards to the bone lesions it is likely that  this is related to his recent trauma.  He states that he fell on his right side and may have fractured some ribs.  The CT scan does not show that there makes lytic and sclerotic lesions on the left side however.  The PET scan will help [place] delineate this.     I  spent 40 minutes with  the patient face to face and greater then 50% of the time was spent in counseling and coordination of care.    Lajuana Matte 01/16/2020 2:46 PM

## 2020-01-16 NOTE — Progress Notes (Signed)
I spoke with patient's wife Marcie Bal explaining Dr. Burr Medico had placed an urgent referral with Dr. Kipp Brood (thoracic surgeon).  He can see him today at 12:30.  She was given the office address, phone number and I told her I would arrive at least 20 minutes prior to the appointment time.  She agreed to this appointment and stated he would be there.

## 2020-01-17 ENCOUNTER — Other Ambulatory Visit: Payer: Self-pay | Admitting: Hematology

## 2020-01-17 DIAGNOSIS — C158 Malignant neoplasm of overlapping sites of esophagus: Secondary | ICD-10-CM

## 2020-01-17 MED ORDER — ONDANSETRON HCL 8 MG PO TABS: 8.0000 mg | ORAL_TABLET | Freq: Two times a day (BID) | ORAL | 1 refills | Status: AC | PRN

## 2020-01-17 MED ORDER — PROCHLORPERAZINE MALEATE 10 MG PO TABS
10.0000 mg | ORAL_TABLET | Freq: Four times a day (QID) | ORAL | 1 refills | Status: AC | PRN
Start: 2020-01-17 — End: ?

## 2020-01-17 NOTE — Progress Notes (Signed)
START ON PATHWAY REGIMEN - Gastroesophageal     Administer weekly during RT:     Paclitaxel      Carboplatin   **Always confirm dose/schedule in your pharmacy ordering system**  Patient Characteristics: Esophageal & GE Junction, Adenocarcinoma, Preoperative or Nonsurgical Candidate (Clinical Staging), cT2 or Higher or cN+, Surgical Candidate (Up to cT4a) - Preoperative Therapy, Esophageal Histology: Adenocarcinoma Disease Classification: Esophageal Therapeutic Status: Preoperative or Nonsurgical Candidate (Clinical Staging) AJCC Grade: G3 AJCC 8 Stage Grouping: Unknown AJCC T Category: cTX AJCC N Category: cNX AJCC M Category: cM0 Intent of Therapy: Curative Intent, Discussed with Patient

## 2020-01-19 ENCOUNTER — Other Ambulatory Visit: Payer: Self-pay | Admitting: *Deleted

## 2020-01-19 ENCOUNTER — Inpatient Hospital Stay (HOSPITAL_COMMUNITY)
Admission: RE | Admit: 2020-01-19 | Payer: No Typology Code available for payment source | Source: Home / Self Care | Admitting: Thoracic Surgery (Cardiothoracic Vascular Surgery)

## 2020-01-19 ENCOUNTER — Telehealth: Payer: Self-pay | Admitting: Hematology

## 2020-01-19 ENCOUNTER — Ambulatory Visit (HOSPITAL_COMMUNITY): Payer: No Typology Code available for payment source

## 2020-01-19 ENCOUNTER — Telehealth: Payer: Self-pay | Admitting: *Deleted

## 2020-01-19 HISTORY — DX: Headache, unspecified: R51.9

## 2020-01-19 HISTORY — DX: Malignant (primary) neoplasm, unspecified: C80.1

## 2020-01-19 SURGERY — CREATION, JEJUNOSTOMY, LAPAROSCOPIC
Anesthesia: General

## 2020-01-19 NOTE — Telephone Encounter (Signed)
R/s appt per 10/4 sch msg - pt wife aware of appts.

## 2020-01-19 NOTE — Telephone Encounter (Signed)
Received vm call from wife, Marcie Bal but unable to understand message.  Thinking she is asking about a dietician appt.  Tried to call back this pm & got vm but unable to leave message.  Will forward to Dr Feng/Pod RN.

## 2020-01-19 NOTE — Progress Notes (Signed)
Spoke with patient's wife to find out why he didn't get j-tube placed.  She states that when he found out he wouldn't be able to lift things he said he didn't want to have this done right now.  She states over the weekend he was able to eat things such as eggs, soft foods without any problems. I explained that the treatment for esophageal cancer only makes swallowing more difficult the more radiation treatments he has and it advisable with his current swallowing difficulties that he get this done.  She verbalized an understanding but feels that he is wanting to keep working as long as he can.

## 2020-01-19 NOTE — Telephone Encounter (Signed)
Malachy Mood,  Could you f/u with his wife tomorrow? Thank    Truitt Merle MD

## 2020-01-20 ENCOUNTER — Encounter: Payer: Self-pay | Admitting: Radiology

## 2020-01-20 ENCOUNTER — Inpatient Hospital Stay: Payer: No Typology Code available for payment source | Attending: Hematology

## 2020-01-20 ENCOUNTER — Other Ambulatory Visit: Payer: No Typology Code available for payment source

## 2020-01-20 ENCOUNTER — Telehealth: Payer: Self-pay | Admitting: Hematology

## 2020-01-20 ENCOUNTER — Ambulatory Visit: Payer: No Typology Code available for payment source | Admitting: Nutrition

## 2020-01-20 ENCOUNTER — Encounter: Payer: No Typology Code available for payment source | Admitting: Nutrition

## 2020-01-20 ENCOUNTER — Ambulatory Visit (HOSPITAL_COMMUNITY)
Admission: RE | Admit: 2020-01-20 | Discharge: 2020-01-20 | Disposition: A | Discharge status: Home / Self Care | Payer: No Typology Code available for payment source | Source: Ambulatory Visit | Attending: Radiation Oncology | Admitting: Radiation Oncology

## 2020-01-20 ENCOUNTER — Other Ambulatory Visit: Payer: Self-pay

## 2020-01-20 ENCOUNTER — Other Ambulatory Visit: Payer: Self-pay | Admitting: *Deleted

## 2020-01-20 DIAGNOSIS — C158 Malignant neoplasm of overlapping sites of esophagus: Secondary | ICD-10-CM

## 2020-01-20 MED ORDER — GADOBUTROL 1 MMOL/ML IV SOLN
8.0000 mL | Freq: Once | INTRAVENOUS | Status: AC | PRN
  Administered 2020-01-20: 8 mL via INTRAVENOUS

## 2020-01-20 NOTE — Progress Notes (Signed)
Spoke to patient's wife Mark Buck to let her know that CT simulation has been scheduled for Thursday 10/7 at 3 pm and plan is start treatment on 10/18.  She verbalized an understanding.

## 2020-01-20 NOTE — Progress Notes (Signed)
54 year old male diagnosed with partially obstructing esophageal cancer.  He is followed by Dr. Burr Medico. Plan is for weekly carbotaxol and radiation treatments.  Past medical history includes dentures.  Medications and labs were reviewed.  Height: 69 inches. Weight: 179 pounds with patient's heavy work boots on. Usual body weight: 200-210 pounds per patient. BMI: Will not be accurate.  Patient reports his boots weigh 10 pounds.  Patient reports he has lost approximately 30 pounds but he is not really sure. He denies nausea and vomiting. He has some solid food dysphagia but is able to drink liquids without difficulty. Patient has declined a jejunostomy feeding tube.  He is worried he will not be able to work once it is placed. Dietary recall reveals patient is tolerating soup, eggs, ice cream, bread, protein powder mix and propel water. Specific nutrition history was difficult to obtain as patient wife did not know the brand of protein powders he was using. Patient reports he normally has 3-4 stools a day and they are always very soft and sometimes liquid.  Reports this has been going on since he was 54 years old.  Nutrition diagnosis:  Unintended weight loss related to esophageal cancer as evidenced by at least a 30 pound weight loss from usual body weight.  Intervention: I educated patient on importance of increasing calories and protein to minimize further weight loss. I reviewed importance of limiting high-calorie, high-protein shakes and smoothies between meals. Encouraged patient's wife to take a picture of protein powder so I can get a better idea of nutritional content. Recommended patient try vanilla Ensure Enlive and make a shake using fruit for improved flavor. Brief discussion on sugar and cancer and evidence-based resources.  Encouraged him to look to his physicians for answers to his questions rather than friends and coworkers.  Monitoring, evaluation, goals: Patient will  tolerate adequate calories and protein to minimize weight loss.  Next visit: Monday, October 25 during infusion.  **Disclaimer: This note was dictated with voice recognition software. Similar sounding words can inadvertently be transcribed and this note may contain transcription errors which may not have been corrected upon publication of note.**

## 2020-01-20 NOTE — Research (Signed)
EXACT SCIENCES,BLOOD SAMPLE COLLECTION TO EVALUATE BIOMARKERS IN SUBJECTS WITH UNTREATED SOLID TUMORS  01/20/2020    11:00AM  CONSENT: Met withDaryl and his following his appointment with dietician, Dory Peru for 30 minutes in a private room in the research department.We reviewedtheExact Sciencesconsent (protocol version date3.0 dated 10-24-2019andHIPPAform(protocol version date3.0 dated 02-07-2018)with patient in their entirety. Explained the purpose of the study along with potential risks and benefits of participation. Reviewed the study requiredquestionnairesand timeline for completing thesequestionnaires. Informed patient that participation is completely voluntary andhemay withdraw consent at any time, but once blood is drawn, it will be retained by [employer].Upon completion ofreview,Namon and his wifewereoffered to ask any questions or express any concerns.They had no questions at this time.Darylsignedand dated the consentand HIPPA form voluntarily. A copy of the signed consent and HIPPA forms were given to patient. Eligibility was confirmed by  myself and verified by Doreatha Martin, RN Clinical Research Nurse. Patient was enrolled into [employer] on the EXACT SCIENCES study and was given PID [id number]. Patient was thanked for his time and I look forward to speaking with him again soon.  PLAN: Patient has MRI, PET Scan, and CT simulation coming up soon. The plan is to call him to complete CRFs and get blood draw prior to starting treatment on 10/18.   Carol Ada, RT(R)(T) Clinical Research Coordinator

## 2020-01-20 NOTE — Telephone Encounter (Signed)
Release: [id number] Faxed medical records to [facility] @ fax [phone]

## 2020-01-20 NOTE — Research (Signed)
2018-01BExact SciencesStoolSample Collection to Evaluate Biomarkers in Subjects with Untreated Solid Tumors  01/20/2020     11:30AM  CONSENT VISIT: Consent visit was done after consent was obtained for Exact Sciences 2018-01.  We reviewedtheExact Sciencessub-studyconsent (protocol version date1.0 datedJuly-03-2020)with patientand his wifein their entirety for 15 minutes. Explained the purpose of the study along with potential risks and benefits of participation. Informed patient that participation is completely voluntary and hemay withdraw consent at any time, but oncestool sample is sent, it will be retained by [employer].Upon completion ofreview, patient was offered to ask any questions or express any concerns.Harvy and his wifeexpressed their understanding of the study and had no questions.Patient signedand dated the consentand HIPPA form voluntarily.Copies of signed/dated consent andHIPPAformwas given to patientandhis wifefortheirrecords. Eligibility was confirmed by myself and verified by Doreatha Martin, RN Clinical Research Nurse. Patient was thanked for his time and willingness to participate in research studies.  EDUCATION: Demo box was used for this visit. I described all contents of the kit (barcode # [id number]) and reiterated to not drink any liquid from the kit and do not take plastic bag out of box. Showed all parts of the kit and explained how to use each piece. I gave patient the Exact Sciences sub-study informational card that included his PID [id number]. Educated patient that stool needs to be collected prior to any treatment or surgery and once he collects the stool to write down the time and date, then call [employer] at the number given and call myself, Clinical Research Coordinator for further confirmation. Patient and his wife were given an opportunity to ask any questions. They did not have any at the time and confirmed understanding of all  instructions.Patient was informed that he would receive $50 gift card at his next visit as long as the stool collection has been sent.   Carol Ada, RT(R)(T) Clinical Research Coordinator

## 2020-01-21 ENCOUNTER — Telehealth: Payer: Self-pay | Admitting: Radiation Oncology

## 2020-01-21 ENCOUNTER — Other Ambulatory Visit: Payer: Self-pay

## 2020-01-21 ENCOUNTER — Other Ambulatory Visit: Payer: No Typology Code available for payment source

## 2020-01-21 ENCOUNTER — Encounter: Payer: No Typology Code available for payment source | Admitting: Nutrition

## 2020-01-21 NOTE — Telephone Encounter (Signed)
I called and spoke with the patient's wife to let her know his brain MRI was negative for metastatic disease. We will follow up with the results of PET scan tomorrow and anticipate simulation for chemoRT which will begin on 02/02/20.

## 2020-01-22 ENCOUNTER — Other Ambulatory Visit: Payer: No Typology Code available for payment source

## 2020-01-22 ENCOUNTER — Ambulatory Visit
Admission: RE | Admit: 2020-01-22 | Discharge: 2020-01-22 | Disposition: A | Discharge status: Home / Self Care | Payer: No Typology Code available for payment source | Source: Ambulatory Visit | Attending: Radiation Oncology | Admitting: Radiation Oncology

## 2020-01-22 ENCOUNTER — Other Ambulatory Visit: Payer: Self-pay

## 2020-01-22 DIAGNOSIS — C155 Malignant neoplasm of lower third of esophagus: Secondary | ICD-10-CM | POA: Diagnosis present

## 2020-01-22 DIAGNOSIS — C158 Malignant neoplasm of overlapping sites of esophagus: Secondary | ICD-10-CM | POA: Diagnosis not present

## 2020-01-22 LAB — GLUCOSE, CAPILLARY: Glucose-Capillary: 85 mg/dL (ref 70–99)

## 2020-01-22 MED ORDER — FLUDEOXYGLUCOSE F - 18 (FDG) INJECTION
9.5000 | Freq: Once | INTRAVENOUS | Status: AC | PRN
  Administered 2020-01-22: 9.94 via INTRAVENOUS

## 2020-01-23 ENCOUNTER — Ambulatory Visit (HOSPITAL_COMMUNITY): Payer: No Typology Code available for payment source

## 2020-01-27 ENCOUNTER — Ambulatory Visit: Payer: No Typology Code available for payment source | Admitting: Hematology

## 2020-01-27 ENCOUNTER — Encounter: Payer: Self-pay | Admitting: *Deleted

## 2020-01-27 ENCOUNTER — Telehealth: Payer: Self-pay | Admitting: Radiology

## 2020-01-27 ENCOUNTER — Telehealth: Payer: Self-pay

## 2020-01-27 ENCOUNTER — Other Ambulatory Visit: Payer: No Typology Code available for payment source

## 2020-01-27 NOTE — Telephone Encounter (Signed)
EXACT SCIENCES 2018-01  01/27/20     10:30AM  PHONE CALL: Confirmed I was speaking with Mark Buck. Spoke with Faiz to complete the CRF worksheet for EXACT SCIENCES study and to inform him I would meet with him tomorrow morning for research labs. Upon patient answering all CRF packet information,  patient's wife stated they are currently at [facility] and are planning to establish care there. I expressed understanding and thanked them for their time.   PLAN: I did e-mail study to see if I need to continue entering data or if patient should come off study since we will not be able to collect any blood. Awaiting response from study. I sent a message to Dr. Burr Medico to inform her of this update.   Carol Ada, RT(R)(T) Clinical Research Coordinator

## 2020-01-27 NOTE — Progress Notes (Signed)
[facility] Work  Clinical Social Work received referral from medical oncology for financial concerns.  CSW contacted patient at home to offer support and assess for needs.  Patient reported he was transferring his treatment/care to [facility] [place] in [place], and declined CSW support.  CSW shared above information with RN.  Johnnye Lana, MSW, LCSW, OSW-C Clinical Social Worker [facility] [phone]

## 2020-01-27 NOTE — Telephone Encounter (Signed)
Called Mark Buck to verify Mark Buck is transfer his care to [place].  No answer.

## 2020-01-27 NOTE — Progress Notes (Signed)
Pharmacist Chemotherapy Monitoring - Initial Assessment    Anticipated start date: 02/02/20   Regimen:  . Are orders appropriate based on the patient's diagnosis, regimen, and cycle? Yes . Does the plan date match the patient's scheduled date? Yes . Is the sequencing of drugs appropriate? Yes . Are the premedications appropriate for the patient's regimen? Yes . Prior Authorization for treatment is: Pending o If applicable, is the correct biosimilar selected based on the patient's insurance? not applicable  Organ Function and Labs: [place] Kitchen Are dose adjustments needed based on the patient's renal function, hepatic function, or hematologic function? Yes . Are appropriate labs ordered prior to the start of patient's treatment? Yes . Other organ system assessment, if indicated: N/A . The following baseline labs, if indicated, have been ordered: N/A  Dose Assessment: . Are the drug doses appropriate? Yes . Are the following correct: o Drug concentrations Yes o IV fluid compatible with drug Yes o Administration routes Yes o Timing of therapy Yes . If applicable, does the patient have documented access for treatment and/or plans for port-a-cath placement? not applicable . If applicable, have lifetime cumulative doses been properly documented and assessed? not applicable Lifetime Dose Tracking  No doses have been documented on this patient for the following tracked chemicals: Doxorubicin, Epirubicin, Idarubicin, Daunorubicin, Mitoxantrone, Bleomycin, Oxaliplatin, Carboplatin, Liposomal Doxorubicin  o   Toxicity Monitoring/Prevention: . The patient has the following take home antiemetics prescribed: Prochlorperazine . The patient has the following take home medications prescribed: N/A . Medication allergies and previous infusion related reactions, if applicable, have been reviewed and addressed. Yes . The patient's current medication list has been assessed for drug-drug interactions with their  chemotherapy regimen. no significant drug-drug interactions were identified on review.  Order Review: . Are the treatment plan orders signed? Yes . Is the patient scheduled to see a provider prior to their treatment? No  I verify that I have reviewed each item in the above checklist and answered each question accordingly.  Mark Buck 01/27/2020 9:35 AM

## 2020-01-28 ENCOUNTER — Telehealth: Payer: Self-pay

## 2020-01-28 ENCOUNTER — Inpatient Hospital Stay: Payer: No Typology Code available for payment source | Admitting: Hematology

## 2020-01-28 ENCOUNTER — Inpatient Hospital Stay: Payer: No Typology Code available for payment source

## 2020-01-28 DIAGNOSIS — C158 Malignant neoplasm of overlapping sites of esophagus: Secondary | ICD-10-CM

## 2020-01-28 NOTE — Telephone Encounter (Signed)
I left vm requesting a call back to verify that Mr Keena will be getting treatment elsewhere.

## 2020-02-02 ENCOUNTER — Ambulatory Visit: Payer: No Typology Code available for payment source

## 2020-02-02 ENCOUNTER — Inpatient Hospital Stay: Payer: No Typology Code available for payment source

## 2020-02-02 ENCOUNTER — Inpatient Hospital Stay: Payer: No Typology Code available for payment source | Admitting: Nurse Practitioner

## 2020-02-03 ENCOUNTER — Ambulatory Visit: Payer: No Typology Code available for payment source

## 2020-02-04 ENCOUNTER — Encounter: Payer: Self-pay | Admitting: Radiology

## 2020-02-04 ENCOUNTER — Ambulatory Visit: Payer: No Typology Code available for payment source

## 2020-02-04 DIAGNOSIS — C158 Malignant neoplasm of overlapping sites of esophagus: Secondary | ICD-10-CM

## 2020-02-04 NOTE — Research (Signed)
EXACT SCIENCES 2018-01B--STOOL COLLECTION  02/04/20    9:15AM  SAMPLE MINDED: Marked stool collection kit Barcode ID [id number] as lost in Sample Minded.   Carol Ada, RT(R)(T) Clinical Research Coordinator

## 2020-02-05 ENCOUNTER — Ambulatory Visit: Payer: No Typology Code available for payment source

## 2020-02-05 ENCOUNTER — Other Ambulatory Visit: Payer: No Typology Code available for payment source

## 2020-02-05 ENCOUNTER — Encounter: Payer: No Typology Code available for payment source | Admitting: Genetic Counselor

## 2020-02-06 ENCOUNTER — Ambulatory Visit: Payer: No Typology Code available for payment source

## 2020-02-09 ENCOUNTER — Encounter: Payer: No Typology Code available for payment source | Admitting: Nutrition

## 2020-02-09 ENCOUNTER — Ambulatory Visit: Payer: No Typology Code available for payment source | Admitting: Hematology

## 2020-02-09 ENCOUNTER — Ambulatory Visit: Payer: No Typology Code available for payment source

## 2020-02-09 ENCOUNTER — Other Ambulatory Visit: Payer: No Typology Code available for payment source

## 2020-02-10 ENCOUNTER — Ambulatory Visit: Payer: No Typology Code available for payment source

## 2020-02-11 ENCOUNTER — Ambulatory Visit: Payer: No Typology Code available for payment source

## 2020-02-12 ENCOUNTER — Ambulatory Visit: Payer: No Typology Code available for payment source

## 2020-02-13 ENCOUNTER — Ambulatory Visit: Payer: No Typology Code available for payment source

## 2020-02-16 ENCOUNTER — Encounter: Payer: No Typology Code available for payment source | Admitting: Nutrition

## 2020-02-16 ENCOUNTER — Other Ambulatory Visit: Payer: No Typology Code available for payment source

## 2020-02-16 ENCOUNTER — Ambulatory Visit: Payer: No Typology Code available for payment source | Admitting: Hematology

## 2020-02-16 ENCOUNTER — Ambulatory Visit: Payer: No Typology Code available for payment source

## 2020-02-17 ENCOUNTER — Ambulatory Visit: Payer: No Typology Code available for payment source

## 2020-02-18 ENCOUNTER — Ambulatory Visit: Payer: No Typology Code available for payment source

## 2020-02-19 ENCOUNTER — Ambulatory Visit: Payer: No Typology Code available for payment source

## 2020-02-20 ENCOUNTER — Ambulatory Visit: Payer: No Typology Code available for payment source

## 2020-02-23 ENCOUNTER — Ambulatory Visit: Payer: No Typology Code available for payment source

## 2020-02-23 ENCOUNTER — Ambulatory Visit: Payer: No Typology Code available for payment source | Admitting: Hematology

## 2020-02-23 ENCOUNTER — Other Ambulatory Visit: Payer: No Typology Code available for payment source

## 2020-02-23 ENCOUNTER — Encounter: Payer: No Typology Code available for payment source | Admitting: Nutrition

## 2020-02-24 ENCOUNTER — Ambulatory Visit: Payer: No Typology Code available for payment source

## 2020-02-25 ENCOUNTER — Ambulatory Visit: Payer: No Typology Code available for payment source

## 2020-02-26 ENCOUNTER — Ambulatory Visit: Payer: No Typology Code available for payment source

## 2020-02-27 ENCOUNTER — Ambulatory Visit: Payer: No Typology Code available for payment source

## 2020-03-01 ENCOUNTER — Encounter: Payer: No Typology Code available for payment source | Admitting: Nutrition

## 2020-03-01 ENCOUNTER — Ambulatory Visit: Payer: No Typology Code available for payment source

## 2020-03-01 ENCOUNTER — Ambulatory Visit: Payer: No Typology Code available for payment source | Admitting: Nurse Practitioner

## 2020-03-01 ENCOUNTER — Other Ambulatory Visit: Payer: No Typology Code available for payment source

## 2020-03-02 ENCOUNTER — Ambulatory Visit: Payer: No Typology Code available for payment source

## 2020-03-03 ENCOUNTER — Ambulatory Visit: Payer: No Typology Code available for payment source

## 2020-03-04 ENCOUNTER — Ambulatory Visit: Payer: No Typology Code available for payment source

## 2020-03-05 ENCOUNTER — Ambulatory Visit: Payer: No Typology Code available for payment source

## 2020-03-08 ENCOUNTER — Ambulatory Visit: Payer: No Typology Code available for payment source

## 2020-03-09 ENCOUNTER — Ambulatory Visit: Payer: No Typology Code available for payment source

## 2020-03-10 ENCOUNTER — Ambulatory Visit: Payer: No Typology Code available for payment source

## 2020-04-23 ENCOUNTER — Other Ambulatory Visit: Payer: Self-pay

## 2020-04-23 ENCOUNTER — Emergency Department (HOSPITAL_COMMUNITY)
Admission: EM | Admit: 2020-04-23 | Discharge: 2020-04-23 | Disposition: A | Discharge status: Home / Self Care | Payer: No Typology Code available for payment source | Attending: Emergency Medicine | Admitting: Emergency Medicine

## 2020-04-23 ENCOUNTER — Encounter (HOSPITAL_COMMUNITY): Payer: Self-pay

## 2020-04-23 ENCOUNTER — Emergency Department (HOSPITAL_COMMUNITY): Payer: No Typology Code available for payment source

## 2020-04-23 DIAGNOSIS — Z8501 Personal history of malignant neoplasm of esophagus: Secondary | ICD-10-CM | POA: Insufficient documentation

## 2020-04-23 DIAGNOSIS — R042 Hemoptysis: Secondary | ICD-10-CM

## 2020-04-23 DIAGNOSIS — R0789 Other chest pain: Secondary | ICD-10-CM | POA: Diagnosis not present

## 2020-04-23 LAB — CBC
HCT: 33.1 % — ABNORMAL LOW (ref 39.0–52.0)
Hemoglobin: 11.2 g/dL — ABNORMAL LOW (ref 13.0–17.0)
MCH: 32.9 pg (ref 26.0–34.0)
MCHC: 33.8 g/dL (ref 30.0–36.0)
MCV: 97.4 fL (ref 80.0–100.0)
Platelets: 146 10*3/uL — ABNORMAL LOW (ref 150–400)
RBC: 3.4 MIL/uL — ABNORMAL LOW (ref 4.22–5.81)
RDW: 21.7 % — ABNORMAL HIGH (ref 11.5–15.5)
WBC: 5.4 10*3/uL (ref 4.0–10.5)
nRBC: 0 % (ref 0.0–0.2)

## 2020-04-23 LAB — COMPREHENSIVE METABOLIC PANEL
ALT: 19 U/L (ref 0–44)
AST: 33 U/L (ref 15–41)
Albumin: 3.2 g/dL — ABNORMAL LOW (ref 3.5–5.0)
Alkaline Phosphatase: 189 U/L — ABNORMAL HIGH (ref 38–126)
Anion gap: 8 (ref 5–15)
BUN: 16 mg/dL (ref 6–20)
CO2: 26 mmol/L (ref 22–32)
Calcium: 8.4 mg/dL — ABNORMAL LOW (ref 8.9–10.3)
Chloride: 102 mmol/L (ref 98–111)
Creatinine, Ser: 0.69 mg/dL (ref 0.61–1.24)
GFR, Estimated: >60 mL/min (ref >60)
Glucose, Bld: 96 mg/dL (ref 70–99)
Potassium: 4.2 mmol/L (ref 3.5–5.1)
Sodium: 136 mmol/L (ref 135–145)
Total Bilirubin: 0.9 mg/dL (ref 0.3–1.2)
Total Protein: 6.2 g/dL — ABNORMAL LOW (ref 6.5–8.1)

## 2020-04-23 LAB — APTT: aPTT: 31 seconds (ref 24–36)

## 2020-04-23 LAB — PROTIME-INR
INR: 1 (ref 0.8–1.2)
Prothrombin Time: 12.5 seconds (ref 11.4–15.2)

## 2020-04-23 LAB — TROPONIN I (HIGH SENSITIVITY)
Troponin I (High Sensitivity): 2 ng/L (ref <18)
Troponin I (High Sensitivity): 2 ng/L (ref <18)

## 2020-04-23 MED ORDER — FENTANYL CITRATE (PF) 100 MCG/2ML IJ SOLN
50.0000 ug | Freq: Once | INTRAMUSCULAR | Status: AC
  Administered 2020-04-23: 50 ug via INTRAVENOUS
  Filled 2020-04-23: qty 2

## 2020-04-23 MED ORDER — IOHEXOL 350 MG/ML SOLN
100.0000 mL | Freq: Once | INTRAVENOUS | Status: AC | PRN
  Administered 2020-04-23: 80 mL via INTRAVENOUS

## 2020-04-23 MED ORDER — SODIUM CHLORIDE 0.9 % IV BOLUS
1000.0000 mL | Freq: Once | INTRAVENOUS | Status: AC
  Administered 2020-04-23: 1000 mL via INTRAVENOUS

## 2020-04-23 MED ORDER — LIDOCAINE-PRILOCAINE 2.5-2.5 % EX CREA
TOPICAL_CREAM | Freq: Once | CUTANEOUS | Status: AC
  Filled 2020-04-23: qty 5

## 2020-04-23 MED ORDER — HEPARIN SOD (PORK) LOCK FLUSH 100 UNIT/ML IV SOLN
500.0000 [IU] | Freq: Once | INTRAVENOUS | Status: AC
  Administered 2020-04-23: 500 [IU]
  Filled 2020-04-23: qty 5

## 2020-04-23 NOTE — ED Triage Notes (Addendum)
Pt arrives POV with a sternal chest pain/ esophagus area pain. Pt reports esophageal cancer  Since 8/28/21and receiving treatment at "[facility] [place]". Pt also sts coughing up approx 63mL of blood.

## 2020-04-23 NOTE — ED Provider Notes (Addendum)
Patient signed to me by Dr. Maurie Boettcher pending CT scan as well as labs.  He does have evidence of esophageal mass which is been well known to the patient.  Patient states that he is able to swallow small sips at this time.  Denies any weakness.  Blood pressure noted and he has had fluctuations with this.  Will check orthostatics.  He denies any weakness or dizziness.  Patient's orthostatics here stable.  He feels stable to go home   Lacretia Leigh, MD 04/23/20 0848    Lacretia Leigh, MD 04/23/20 [phone]

## 2020-04-23 NOTE — Discharge Instructions (Addendum)
Follow-up in the cancer center next week

## 2020-04-23 NOTE — ED Provider Notes (Signed)
[facility] Emergency Department Provider Note MRN:  [id number]  Arrival date & time: 04/23/20     Chief Complaint   Hemoptysis   History of Present Illness   Mark Buck is a 55 y.o. year-old male with a history of esophageal cancer presenting to the ED with chief complaint of hemoptysis.  Stage IV esophageal cancer, experiencing some central chest discomfort today associated with coughing up blood.  Also having some spit up or vomiting of watery material.  Denies fever, no abdominal pain, no other complaints.  Symptoms are constant, mild to moderate, no other exacerbating or alleviating factors.  Review of Systems  A complete 10 system review of systems was obtained and all systems are negative except as noted in the HPI and PMH.   Patient's Health History    Past Medical History:  Diagnosis Date  . Cancer ([place])    esophageal cancer  . Headache   . Wears dentures     Past Surgical History:  Procedure Laterality Date  . BIOPSY  01/09/2020   Procedure: BIOPSY;  Surgeon: Carol Ada, MD;  Location: [facility];  Service: Endoscopy;;  . ESOPHAGOGASTRODUODENOSCOPY (EGD) WITH PROPOFOL N/A 01/09/2020   Procedure: ESOPHAGOGASTRODUODENOSCOPY (EGD) WITH PROPOFOL;  Surgeon: Carol Ada, MD;  Location: [facility];  Service: Endoscopy;  Laterality: N/A;    Family History  Problem Relation Age of Onset  . Thyroid cancer Sister   . Breast cancer Maternal Great-grandmother   . Cancer Maternal Grandfather        skin cancer   . Cancer Other        GGM breast cancer     Social History   Socioeconomic History  . Marital status: Married    Spouse name: Not on file  . Number of children: 2  . Years of education: Not on file  . Highest education level: Not on file  Occupational History    Comment: [employer]   Tobacco Use  . Smoking status: Never Smoker  . Smokeless tobacco: Never Used  Vaping Use  . Vaping Use: Never used  Substance and  Sexual Activity  . Alcohol use: No  . Drug use: No  . Sexual activity: Never  Other Topics Concern  . Not on file  Social History Narrative  . Not on file   Social Determinants of Health   Financial Resource Strain: Not on file  Food Insecurity: Not on file  Transportation Needs: Not on file  Physical Activity: Not on file  Stress: Not on file  Social Connections: Not on file  Intimate Partner Violence: Not on file     Physical Exam   Vitals:   04/23/20 0630 04/23/20 0645  BP: 106/70 100/72  Pulse: 90 88  Resp: 13 15  Temp:    SpO2: 96% 94%    CONSTITUTIONAL: Chronically ill-appearing, NAD NEURO:  Alert and oriented x 3, no focal deficits EYES:  eyes equal and reactive ENT/NECK:  no LAD, no JVD CARDIO:  regular rate, well-perfused, normal S1 and S2 PULM:  CTAB no wheezing or rhonchi GI/GU:  normal bowel sounds, non-distended, non-tender MSK/SPINE:  No gross deformities, no edema SKIN:  no rash, atraumatic PSYCH:  Appropriate speech and behavior  *Additional and/or pertinent findings included in MDM below  Diagnostic and Interventional Summary    EKG Interpretation  Date/Time:  Friday April 23 2020 01:29:00 EST Ventricular Rate:  81 PR Interval:    QRS Duration: 106 QT Interval:  372 QTC Calculation:  432 R Axis:   -77 Text Interpretation: Sinus rhythm Incomplete RBBB and LAFB Probable anteroseptal infarct, old 93 Lead; [place] Confirmed by Gerlene Fee [phone]) on 04/23/2020 4:12:09 AM      Labs Reviewed  CBC - Abnormal; Notable for the following components:      Result Value   RBC 3.40 (*)    Hemoglobin 11.2 (*)    HCT 33.1 (*)    RDW 21.7 (*)    Platelets 146 (*)    All other components within normal limits  COMPREHENSIVE METABOLIC PANEL - Abnormal; Notable for the following components:   Calcium 8.4 (*)    Total Protein 6.2 (*)    Albumin 3.2 (*)    Alkaline Phosphatase 189 (*)    All other components within normal limits  PROTIME-INR   APTT  TROPONIN I (HIGH SENSITIVITY)  TROPONIN I (HIGH SENSITIVITY)    CT ANGIO CHEST PE W OR WO CONTRAST    (Results Pending)    Medications  sodium chloride 0.9 % bolus 1,000 mL (1,000 mLs Intravenous New Bag/Given 04/23/20 0612)  fentaNYL (SUBLIMAZE) injection 50 mcg (50 mcg Intravenous Given 04/23/20 0631)  lidocaine-prilocaine (EMLA) cream ( Topical Given 04/23/20 0531)     Procedures  /  Critical Care Procedures  ED Course and Medical Decision Making  I have reviewed the triage vital signs, the nursing notes, and pertinent available records from the EMR.  Listed above are laboratory and imaging tests that I personally ordered, reviewed, and interpreted and then considered in my medical decision making (see below for details).  Advanced cancer, chest pain, hemoptysis, considering PE.  Also considering complications relating to esophageal mass.  Will evaluate with CT imaging     Awaiting labs and CT imaging.  Overall suspect patient's symptoms are related to underlying cancer and if without emergent process would be a candidate for discharge.  Signed out to oncoming provider at shift change.  Barth Kirks. Sedonia Small, [place] [email or web address]  Final Clinical Impressions(s) / ED Diagnoses     ICD-10-CM   1. Hemoptysis  R04.2     ED Discharge Orders    None       Discharge Instructions Discussed with and Provided to Patient:   Discharge Instructions   None       Maudie Flakes, MD 04/23/20 [phone]

## 2020-04-23 NOTE — ED Notes (Signed)
Pt and family updated on plan of care. NAD noted.

## 2020-05-11 ENCOUNTER — Encounter (HOSPITAL_COMMUNITY): Payer: Self-pay

## 2020-05-31 ENCOUNTER — Telehealth: Payer: Self-pay | Admitting: Radiation Oncology

## 2020-05-31 NOTE — Telephone Encounter (Signed)
Lattie Haw, from radiation oncology at [facility], wanted to see if patient did/did not have a radiation treatment from [place]. I have confirmed that we consulted and SIM'd patient, but no treatment was done.

## 2020-06-23 ENCOUNTER — Ambulatory Visit: Payer: No Typology Code available for payment source | Admitting: Dermatology

## 2020-07-16 DEATH — deceased

## 2020-08-26 ENCOUNTER — Ambulatory Visit: Payer: No Typology Code available for payment source | Admitting: Dermatology

## 2022-04-03 IMAGING — CT NM PET TUM IMG INITIAL (PI) SKULL BASE T - THIGH
1 of 9 series · 1 of 25 positions shown · non-contrast
Comparison: 01/09/2020 CT scan

CLINICAL DATA: Initial treatment strategy for esophageal cancer.

EXAM:
NUCLEAR MEDICINE PET SKULL BASE TO THIGH
TECHNIQUE: 9.9 mCi F-18 FDG was injected intravenously. Full-ring PET imaging
was performed from the skull base to thigh after the radiotracer. CT
data was obtained and used for attenuation correction and anatomic
localization.
Fasting blood glucose: 85 mg/dl

[Series 3: ct wb 5.0 b30f · axial · 5.0mm · 0.98mm/px · 1 of 290 slices shown]
[im 290/290  brain]
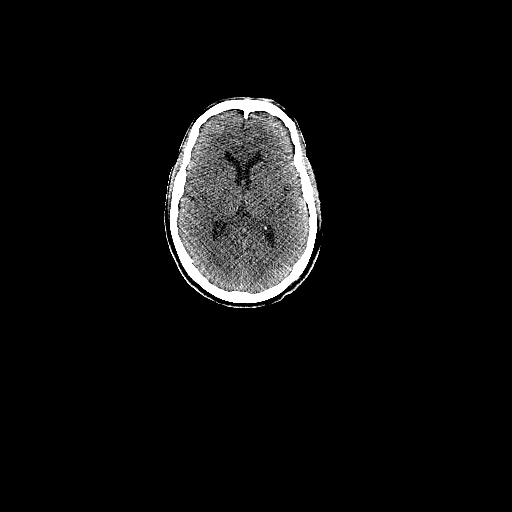

[1 of 25 positions shown; findings below may reference images not displayed]

FINDINGS: Mediastinal blood pool activity: SUV max

Liver activity: SUV max NA

NECK: No significant abnormal hypermetabolic activity in this
region.

Incidental CT findings: none

CHEST: Distal esophageal mass maximum SUV 15.1.

Posterior para-aortic lymph node 1.3 cm in short axis on image
104/3, maximum SUV 10.3, compatible with malignancy. A smaller
paraesophageal lymph node just cephalad to the level of the mass
measures 0.7 cm in short axis on image 98 of series 3 with maximum
SUV of 3.5 probably reflecting early malignant involvement.

The small pulmonary nodules shown on the prior exam are not
appreciably hypermetabolic but are below sensitive PET-CT size
thresholds and merit surveillance.

Incidental CT findings: Coronary, aortic arch, and branch vessel
atherosclerotic vascular disease. Azygos fissure noted.

ABDOMEN/PELVIS: Hypermetabolic right gastric lymph nodes just below
the hiatus. The more right eccentric lymph node measures 1.5 cm in
short axis on image 148 of series 3 with maximum SUV of 8.3,
compatible with malignant involvement.

Incidental CT findings: None of the liver lesions are
hypermetabolic, these are compatible with hemangiomas and cysts.
Aortoiliac atherosclerotic vascular disease.

SKELETON: Hypermetabolic osseous metastatic lesions involving
multiple ribs, the right inferior scapula, the bony pelvis, and the
right proximal femur. There is also a suspected right vertebral body
lesion at the T11 vertebral level index lytic expansile lesion of
the right seventh rib has a maximum SUV of 13.0. Index lytic lesion
of the left anterior iliac bone has a maximum SUV of 11.0.

Incidental CT findings: none
IMPRESSION: 1. Hypermetabolic large distal esophageal mass with scattered
skeletal metastatic lesions as well as hypermetabolic nodal
involvement in the lower chest, and also in the upper abdomen just
below the hiatus.
2. Benign liver lesions compatible with hemangiomas and cysts.
3.  Aortic Atherosclerosis ([id number]).  Coronary atherosclerosis.
4. The very small pulmonary nodules previously shown primarily at
the lung bases are not appreciably hypermetabolic but are below
sensitive PET-CT size thresholds, and merit surveillance.
